# Patient Record
Sex: Female | Born: 1955 | Race: Black or African American | Hispanic: No | Marital: Married
Health system: Northeastern US, Academic
[De-identification: ages and names within clinical notes are randomized; demographics above are authoritative.]

## PROBLEM LIST (undated history)

## (undated) DIAGNOSIS — I10 Essential (primary) hypertension: Secondary | ICD-10-CM

---

## 2018-01-10 ENCOUNTER — Encounter (HOSPITAL_COMMUNITY): Payer: Self-pay

## 2018-01-10 ENCOUNTER — Emergency Department (HOSPITAL_COMMUNITY): Payer: No Typology Code available for payment source

## 2018-01-10 ENCOUNTER — Emergency Department (HOSPITAL_COMMUNITY)
Admission: EM | Admit: 2018-01-10 | Discharge: 2018-01-10 | Disposition: A | Discharge status: Home / Self Care | Payer: No Typology Code available for payment source | Attending: Emergency Medicine | Admitting: Emergency Medicine

## 2018-01-10 ENCOUNTER — Other Ambulatory Visit: Payer: Self-pay

## 2018-01-10 DIAGNOSIS — Y9301 Activity, walking, marching and hiking: Secondary | ICD-10-CM | POA: Insufficient documentation

## 2018-01-10 DIAGNOSIS — W1830XA Fall on same level, unspecified, initial encounter: Secondary | ICD-10-CM | POA: Insufficient documentation

## 2018-01-10 DIAGNOSIS — Y92481 Parking lot as the place of occurrence of the external cause: Secondary | ICD-10-CM | POA: Diagnosis not present

## 2018-01-10 DIAGNOSIS — Y999 Unspecified external cause status: Secondary | ICD-10-CM | POA: Insufficient documentation

## 2018-01-10 DIAGNOSIS — S8991XA Unspecified injury of right lower leg, initial encounter: Secondary | ICD-10-CM | POA: Diagnosis present

## 2018-01-10 DIAGNOSIS — M25561 Pain in right knee: Secondary | ICD-10-CM | POA: Insufficient documentation

## 2018-01-10 HISTORY — DX: Essential (primary) hypertension: I10

## 2018-01-10 MED ORDER — IBUPROFEN 800 MG PO TABS
800.0000 mg | ORAL_TABLET | Freq: Once | ORAL | Status: AC
  Administered 2018-01-10: 800 mg via ORAL
  Filled 2018-01-10: qty 1

## 2018-01-10 NOTE — Discharge Instructions (Addendum)
Your x-rays look good! There are no fractures or dislocations.   You are on the right track with keeping your leg elevated and icing it when you are at rest. You may use Tylenol and/or Ibuprofen/Naproxen for pain relief and swelling. Don't be surprised if you are more sore tomorrow.  Thank you for allowing me to take care of you today! [facility] and enjoy the rest of your conference!

## 2018-01-10 NOTE — ED Provider Notes (Signed)
[facility]-EMERGENCY DEPT Provider Note  CSN: [id number] Arrival date & time: 01/10/18  1135  History   Chief Complaint Chief Complaint  Patient presents with  . Knee Injury    HPI Melissa Hendricks is a 62 y.o. female with a history of HTN who presented to the ED following a mechanical fall this morning. Patient reportedly was walking when she tripped over a parking barrier while carrying bags. The accident occurred 1 hour ago. It is reported that the patient did not have LOC and was ambulatory on scene. She complains of right knee pain and swelling. Patient states she is able to bear weight and ambulate normally, but it is a little painful. Denies dizziness, lightheadedness, paresthesias, weakness or foot drop preceding the accident. Patient has been icing her knee since the accident.       Past Medical History:  Diagnosis Date  . Hypertension     There are no active problems to display for this patient.   History reviewed. No pertinent surgical history.   OB History   None      Home Medications    Prior to Admission medications   Not on File    Family History No family history on file.  Social History Social History   Tobacco Use  . Smoking status: Never Smoker  . Smokeless tobacco: Never Used  Substance Use Topics  . Alcohol use: Never    Frequency: Never  . Drug use: Never     Allergies   Patient has no known allergies.   Review of Systems Review of Systems  Constitutional: Negative.   Eyes: Negative for visual disturbance.  Musculoskeletal: Positive for arthralgias and joint swelling. Negative for gait problem.  Skin: Negative for color change, pallor and wound.  Neurological: Negative for dizziness, weakness, light-headedness and numbness.     Physical Exam Updated Vital Signs BP 120/89 (BP Location: Left Arm)   Pulse (!) 102   Temp 98 F (36.7 C) (Oral)   Resp 17   Ht 5' 4.75" (1.645 m)   Wt 98.4 kg (217 lb)    SpO2 96%   BMI 36.39 kg/m   Physical Exam  Cardiovascular:  Pulses:      Dorsalis pedis pulses are 2+ on the right side, and 2+ on the left side.       Posterior tibial pulses are 2+ on the right side, and 2+ on the left side.  Musculoskeletal:       Right hip: Normal.       Left hip: Normal.       Right knee: She exhibits swelling. She exhibits normal range of motion, no effusion, normal alignment, no LCL laxity, normal patellar mobility, no bony tenderness and no MCL laxity. Tenderness found.       Left knee: Normal.       Right ankle: Normal.       Left ankle: Normal.  Right knee is swollen below the patella when compared to the left. No overt ecchymosis visualized. No abrasions or open wounds. Right lower leg tender to palpation along the lateral aspect of knee. No bony tenderness. Negative ACL/PCL/MCL/LCL laxity tests. 5/5 strength in lower extremities bilaterally with full active and passive ROM.   Neurological: She has normal strength. No sensory deficit. She exhibits normal muscle tone. Coordination and gait normal.  Reflex Scores:      Patellar reflexes are 2+ on the right side and 2+ on the left side.  Achilles reflexes are 2+ on the right side and 2+ on the left side. Skin: Skin is warm and intact. No abrasion, no bruising and no ecchymosis noted.     ED Treatments / Results  Labs (all labs ordered are listed, but only abnormal results are displayed) Labs Reviewed - No data to display  EKG None  Radiology Dg Knee Complete 4 Views Right  Result Date: 01/10/2018 CLINICAL DATA:  Right knee injury when the patient tripped over a parking barrier while walking today. Initial encounter. EXAM: RIGHT KNEE - COMPLETE 4+ VIEW COMPARISON:  None. FINDINGS: No evidence of fracture, dislocation, or joint effusion. No evidence of arthropathy or other focal bone abnormality. Soft tissues are unremarkable. IMPRESSION: Normal exam. Electronically Signed   By: Drusilla Kanner M.D.    On: 01/10/2018 12:32    Procedures Procedures (including critical care time)  Medications Ordered in ED Medications  ibuprofen (ADVIL,MOTRIN) tablet 800 mg (800 mg Oral Given 01/10/18 1301)     Initial Impression / Assessment and Plan / ED Course  Triage vital signs and the nursing notes have been reviewed.  Pertinent labs & imaging results that were available during care of the patient were reviewed and considered in medical decision making (see chart for details).  Patient presents after a mechanical fall where she landed on her right knee. She has full sensation in right lower extremity. She also has full active and passive ROM. There is swelling below the patella, but no effusion. Patient is able to bear full weight and ambulate without issue, assistance or pain. Neurovascular function is intact. No laxity in ACL/PCL/MCL/LCL. Physical exam and x-rays are reassuring. There are no other physical exam findings or s/s that suggest an underlying infectious or rheumatologic process that warrants evaluation.   Clinical Course as of Jan 11 1344  Wed Jan 10, 2018  1247 Knee x-ray was normal. No fractures or dislocations.   [GM]    Clinical Course User Index [GM] Mortis, Sharyon Medicus, PA-C   Final Clinical Impressions(s) / ED Diagnoses  1. Right Knee Pain. Education provided on OTC and supportive treatment for pain relief and inflammation.  Dispo: Home. After thorough clinical evaluation, this patient is determined to be medically stable and can be safely discharged with the previously mentioned treatment and/or outpatient follow-up/referral(s). At this time, there are no other apparent medical conditions that require further screening, evaluation or treatment.  Final diagnoses:  Acute pain of right knee    ED Discharge Orders    None        Reva Bores 01/10/18 1345    Bethann Berkshire, MD 01/10/18 [phone]

## 2018-01-10 NOTE — ED Triage Notes (Signed)
Pt states she was walking, not paying attention to the ground and tripped on a parking barrier. Right knee swelling noted

## 2019-11-03 IMAGING — CR DG KNEE COMPLETE 4+V*R*
4 series · 4 of 4 positions shown · non-contrast
Comparison: None.

CLINICAL DATA: Right knee injury when the patient tripped over a
parking barrier while walking today. Initial encounter.

EXAM:
RIGHT KNEE - COMPLETE 4+ VIEW

[t knee ap right]
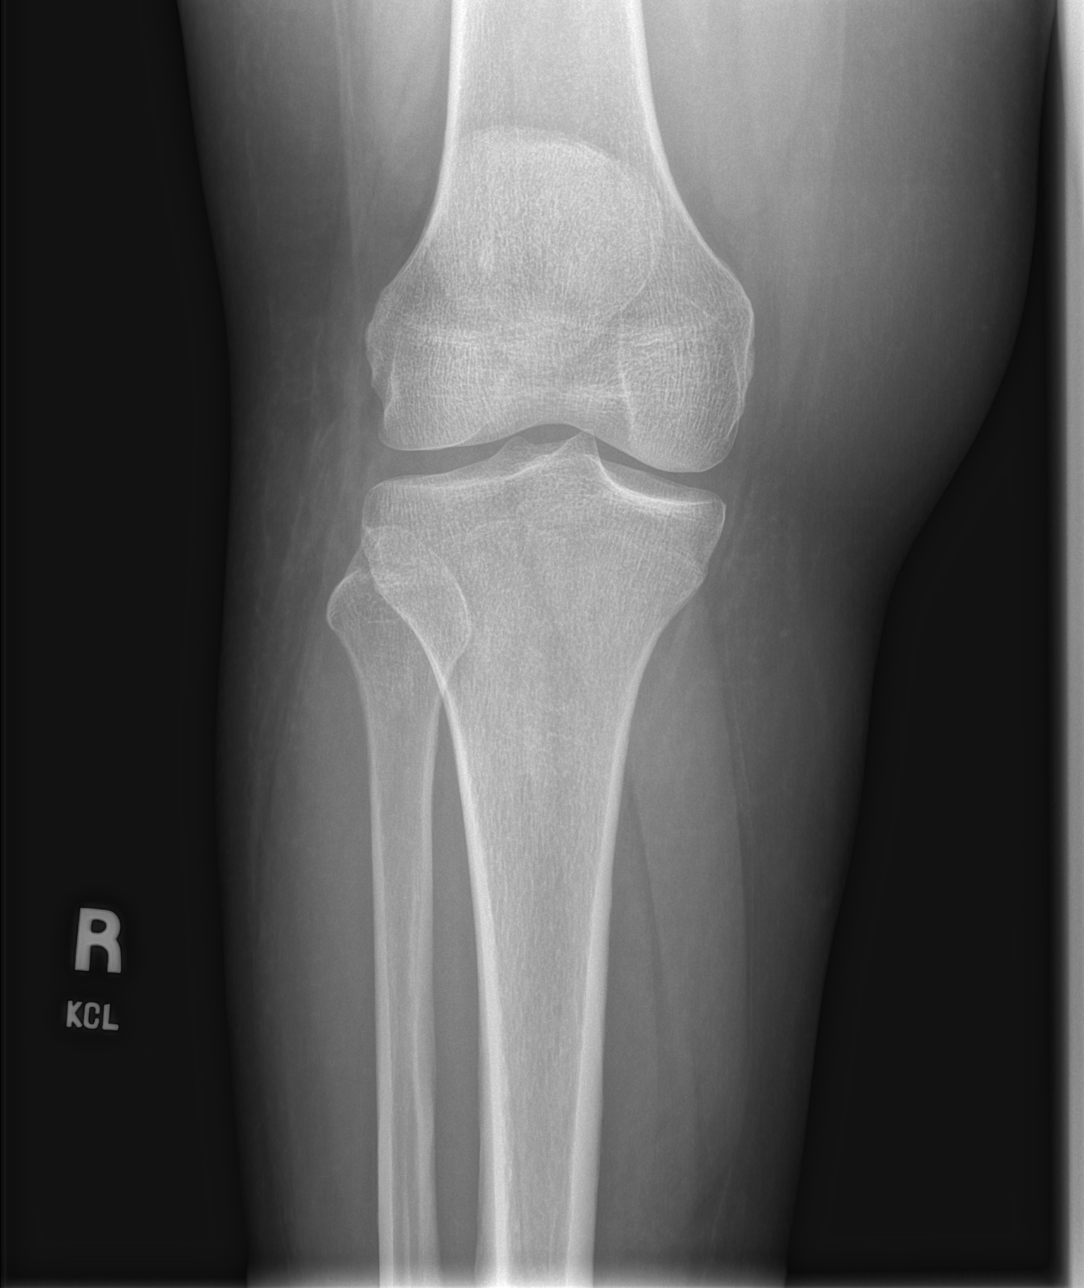

[t knee obl right (1 of 2)]
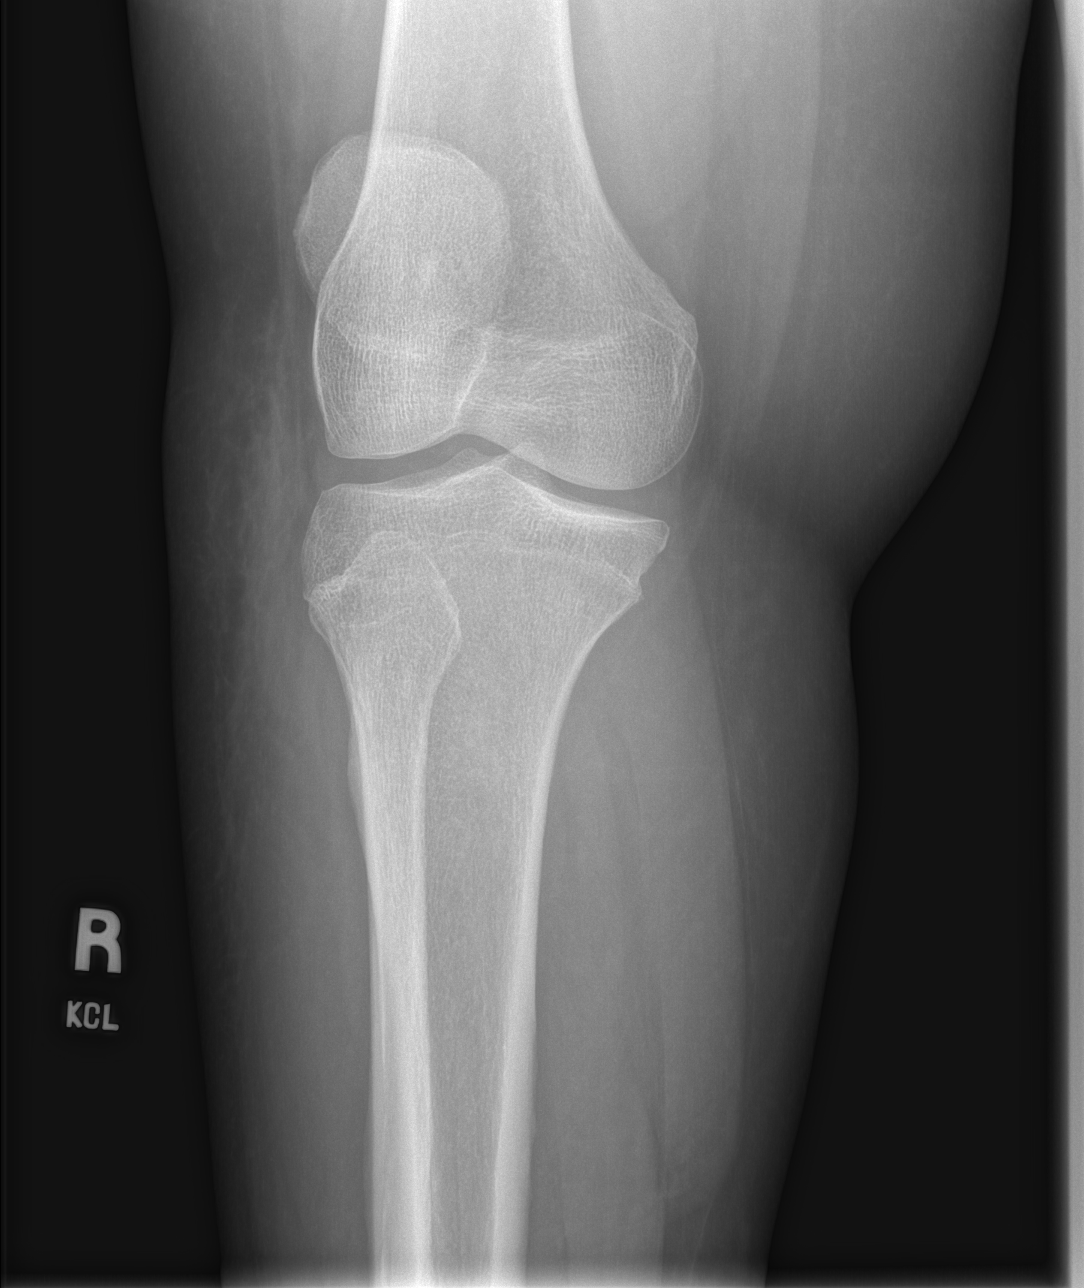

[t knee obl right (2 of 2)]
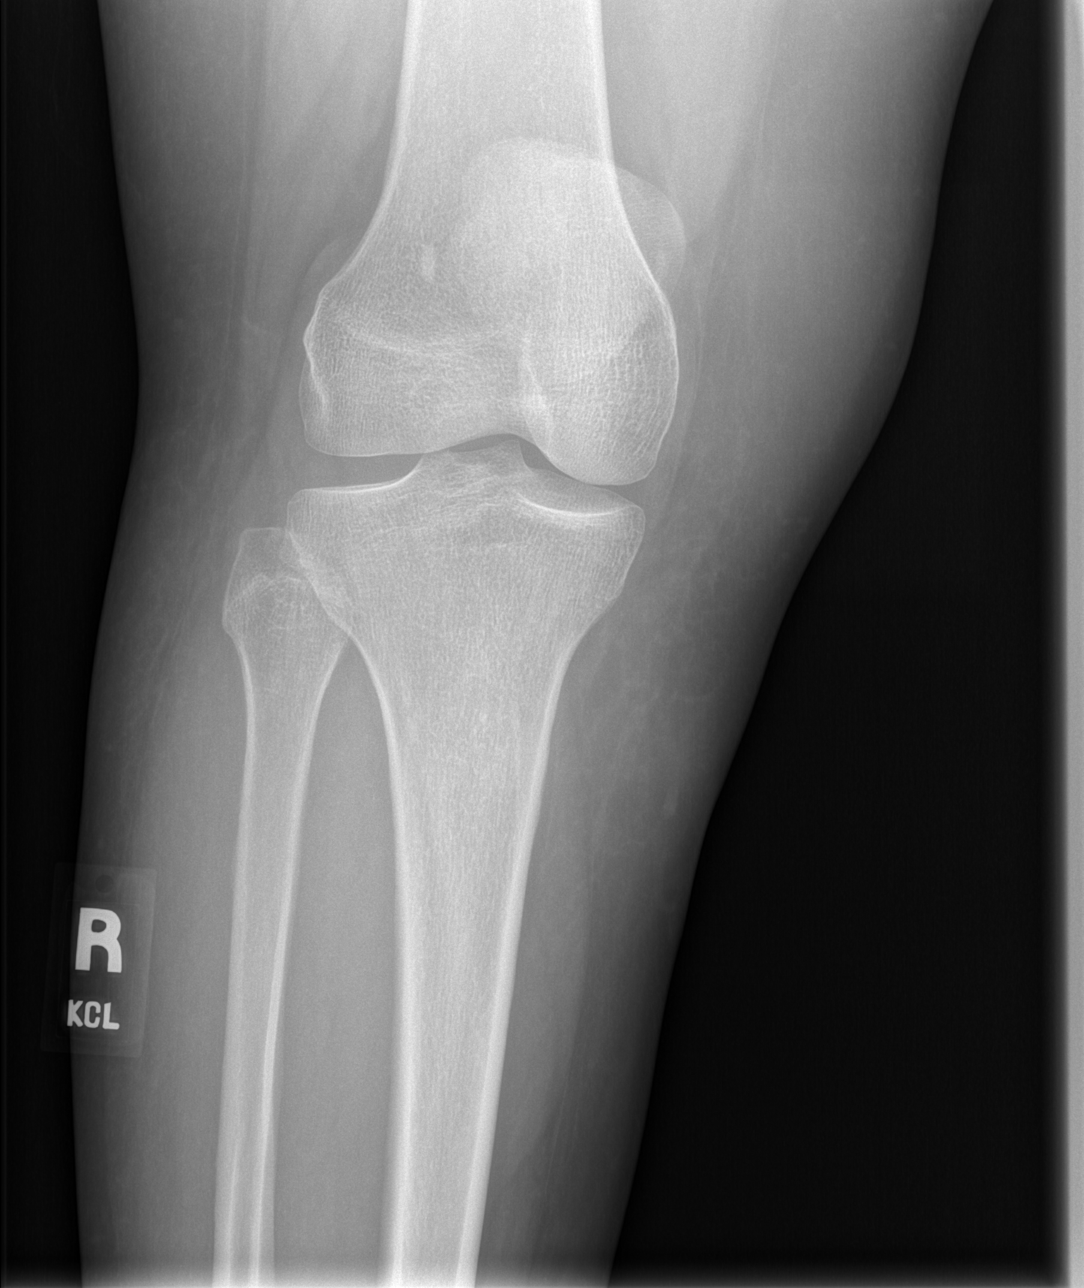

[t knee lat right]
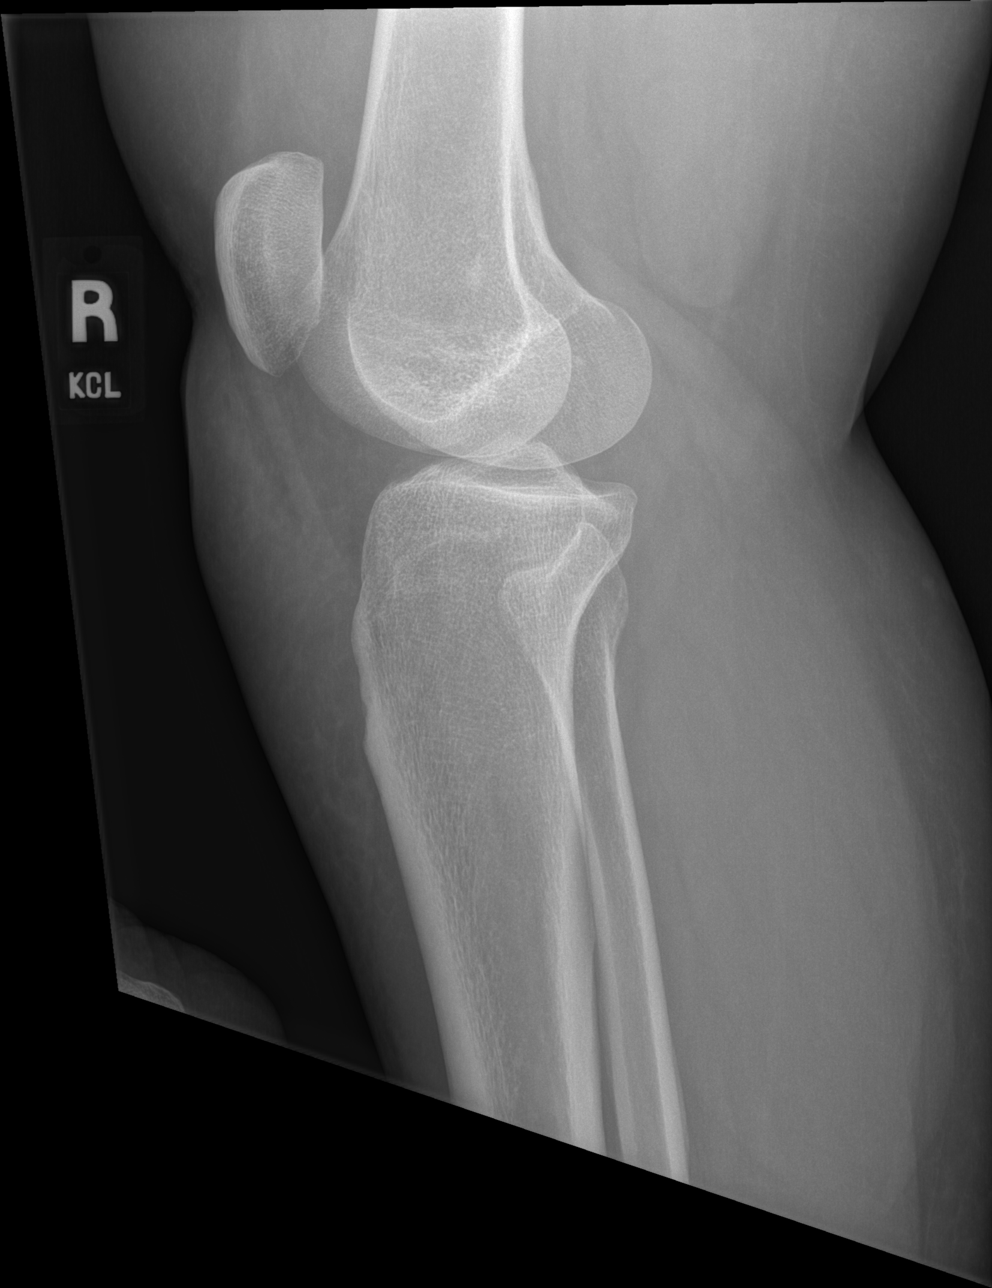

[4 of 4 positions shown; findings below may reference images not displayed]

FINDINGS: No evidence of fracture, dislocation, or joint effusion. No evidence
of arthropathy or other focal bone abnormality. Soft tissues are
unremarkable.
IMPRESSION: Normal exam.

## 2020-10-30 ENCOUNTER — Encounter

## 2020-10-30 ENCOUNTER — Encounter (INDEPENDENT_AMBULATORY_CARE_PROVIDER_SITE_OTHER): Admitting: Internal Medicine

## 2020-10-30 ENCOUNTER — Other Ambulatory Visit

## 2020-10-30 ENCOUNTER — Other Ambulatory Visit: Admit: 2020-10-30 | Payer: MEDICARE | Primary: Internal Medicine

## 2020-10-30 ENCOUNTER — Ambulatory Visit: Admit: 2020-10-30 | Discharge: 2020-10-30 | Payer: MEDICARE | Attending: Internal Medicine | Primary: Internal Medicine

## 2020-10-30 VITALS — BP 107/73 | HR 75 | Ht 64.8 in | Wt 210.0 lb

## 2020-10-30 DIAGNOSIS — Z0000 Encounter for general adult medical examination without abnormal findings: Secondary | ICD-10-CM

## 2020-10-30 DIAGNOSIS — E119 Type 2 diabetes mellitus without complications: Secondary | ICD-10-CM

## 2020-10-30 LAB — URINALYSIS REFLEX TO CULTURE
Bilirubin, Ur: NEGATIVE
Blood, Ur: NEGATIVE
Glucose, Ur: NEGATIVE
Hyaline Casts, Ur: 1.5 /LPF (ref 0–8)
Leukocyte Esterase, Ur: NEGATIVE
Nitrite, Ur: NEGATIVE
RBC, Ur: 3.2 /HPF (ref 0–4)
Specific Gravity, Ur: 1.023 (ref 1.001–1.035)
Squamous Epithelial Cells, Ur: NEGATIVE /HPF
Urobilinogen, Ur: 1 EU
WBC, Ur: 1 /HPF (ref 0–5)
pH, Ur: 6

## 2020-10-30 LAB — COMPREHENSIVE METABOLIC PANEL
ALT: 14 U/L (ref 0–55)
AST: 14 U/L (ref 6–42)
Albumin: 4.5 g/dL (ref 3.2–5.0)
Alkaline phosphatase: 87 U/L (ref 30–130)
Anion Gap: 10 mmol/L (ref 3–14)
BUN: 17 mg/dL (ref 6–24)
Bilirubin, total: 0.7 mg/dL (ref 0.2–1.2)
CO2 (Bicarbonate): 31 mmol/L (ref 20–32)
Calcium: 10.2 mg/dL (ref 8.5–10.5)
Chloride: 98 mmol/L (ref 98–110)
Creatinine: 0.97 mg/dL (ref 0.55–1.30)
Glucose: 156 mg/dL — ABNORMAL HIGH (ref 70–139)
Potassium: 4 mmol/L (ref 3.6–5.2)
Protein, total: 8 g/dL (ref 6.0–8.4)
Sodium: 139 mmol/L (ref 135–146)
eGFRcr: 64 mL/min/{1.73_m2} (ref >=60)

## 2020-10-30 LAB — CBC WITH DIFFERENTIAL
Basophils %: 0.4 %
Basophils Absolute: 0.04 10*3/uL (ref 0.00–0.22)
Eosinophils %: 1 %
Eosinophils Absolute: 0.09 10*3/uL (ref 0.00–0.50)
Hematocrit: 35 % (ref 32.0–47.0)
Hemoglobin: 11.9 g/dL (ref 11.0–16.0)
Immature Granulocytes %: 0.4 %
Immature Granulocytes Absolute: 0.04 10*3/uL (ref 0.00–0.10)
Lymphocyte %: 33.3 %
Lymphocytes Absolute: 3.11 10*3/uL (ref 0.70–4.00)
MCH: 25.8 pg — ABNORMAL LOW (ref 26.0–34.0)
MCHC: 34 g/dL (ref 31.0–37.0)
MCV: 75.9 fL — ABNORMAL LOW (ref 80.0–100.0)
MPV: 9.9 fL (ref 9.1–12.4)
Monocytes %: 5.7 %
Monocytes Absolute: 0.53 10*3/uL (ref 0.36–0.77)
NRBC %: 0 % (ref 0.0–0.0)
NRBC Absolute: 0 10*3/uL (ref 0.00–2.00)
Neutrophil %: 59.2 %
Neutrophils Absolute: 5.53 10*3/uL (ref 1.50–7.95)
Platelets: 376 10*3/uL (ref 150–400)
RBC: 4.61 M/uL (ref 3.70–5.20)
RDW-CV: 14.2 % (ref 11.5–14.5)
RDW-SD: 38.5 fL (ref 35.0–51.0)
WBC: 9.3 10*3/uL (ref 4.0–11.0)

## 2020-10-30 LAB — RAINBOW DRAW SST GOLD TOP

## 2020-10-30 LAB — ALBUMIN, URINE, RANDOM (INCLUDING CREATININE)
Albumin, urine (INT/EXT): 6 mg/dL
Albumin/Creatinine ratio: 30 mg/g{creat} (ref <=30)
Creatinine, urine, random (INT/EXT): 200.6 mg/dL (ref 15.00–328.00)

## 2020-10-30 LAB — LIPID PANEL
Cholesterol/HDL Ratio: 3.3
Cholesterol: 186 mg/dL (ref <=200)
HDL cholesterol: 57 mg/dL (ref >=40)
LDL cholesterol, calculated: 115 mg/dL (ref 0–130)
Triglycerides: 70 mg/dL (ref <=150)

## 2020-10-30 LAB — FOLATE: Folate: 19.5 ng/mL (ref >=3.5)

## 2020-10-30 LAB — TSH WITH REFLEX: TSH: 1.85 u[IU]/mL (ref 0.35–4.94)

## 2020-10-30 LAB — VITAMIN B12: Vitamin B-12: 444 pg/mL (ref 193–986)

## 2020-10-30 LAB — HEMOGLOBIN A1C: HEMOGLOBIN A1C % (INT/EXT): 7.3 % — ABNORMAL HIGH (ref <5.6)

## 2020-10-30 LAB — URIC ACID: Uric Acid: 7.4 mg/dL (ref 2.6–8.0)

## 2020-11-02 ENCOUNTER — Encounter (INDEPENDENT_AMBULATORY_CARE_PROVIDER_SITE_OTHER): Admitting: Internal Medicine

## 2020-11-04 LAB — VITAMIN D 25 HYDROXY
Vit D, 25-Hydroxy: 42 ng/mL (ref 20–100)
Vitamin D 25-OH, D2: <2 ng/mL
Vitamin D 25-OH, D3: 42 ng/mL

## 2020-11-11 ENCOUNTER — Telehealth (INDEPENDENT_AMBULATORY_CARE_PROVIDER_SITE_OTHER): Admitting: Internal Medicine

## 2020-11-11 NOTE — Telephone Encounter (Signed)
Please call patient to let her know  Blood count is normal, kidney function normal, liver normal, potassium normal, A1C is  Now 7.3 moving in the right direction and well controlled. Urine was clear

## 2020-11-12 NOTE — Telephone Encounter (Signed)
Pt returning call   [phone]

## 2020-11-20 NOTE — Telephone Encounter (Signed)
Ultrasound ordered due to dysuria and chronic pain on urination

## 2021-01-04 ENCOUNTER — Telehealth (INDEPENDENT_AMBULATORY_CARE_PROVIDER_SITE_OTHER): Admitting: Internal Medicine

## 2021-01-04 NOTE — Telephone Encounter (Signed)
 F/u on WQ - LMOM to schedule BP f/up per RG for August 2022

## 2021-02-15 ENCOUNTER — Other Ambulatory Visit

## 2021-02-16 ENCOUNTER — Ambulatory Visit: Payer: MEDICARE | Attending: Internal Medicine | Primary: Internal Medicine

## 2021-02-16 ENCOUNTER — Encounter (INDEPENDENT_AMBULATORY_CARE_PROVIDER_SITE_OTHER): Admitting: Internal Medicine

## 2021-02-25 ENCOUNTER — Other Ambulatory Visit (INDEPENDENT_AMBULATORY_CARE_PROVIDER_SITE_OTHER)

## 2021-02-25 ENCOUNTER — Encounter (INDEPENDENT_AMBULATORY_CARE_PROVIDER_SITE_OTHER)

## 2021-02-25 ENCOUNTER — Ambulatory Visit: Admit: 2021-02-25 | Discharge: 2021-02-25 | Payer: MEDICARE | Attending: Internal Medicine | Primary: Internal Medicine

## 2021-02-25 ENCOUNTER — Ambulatory Visit: Attending: Internal Medicine | Admitting: Internal Medicine

## 2021-02-25 ENCOUNTER — Encounter

## 2021-02-25 ENCOUNTER — Other Ambulatory Visit

## 2021-02-25 ENCOUNTER — Encounter (INDEPENDENT_AMBULATORY_CARE_PROVIDER_SITE_OTHER): Admitting: Internal Medicine

## 2021-02-25 VITALS — BP 120/80 | HR 83 | Temp 97.2°F | Ht 64.8 in | Wt 219.0 lb

## 2021-02-25 DIAGNOSIS — I10 Essential (primary) hypertension: Secondary | ICD-10-CM

## 2021-02-25 LAB — POCT GLUCOSE: Glucose Blood, POC: 163 mg/dL — AB (ref 70–139)

## 2021-02-25 LAB — POCT HGB A1C: POCT Hemoglobin A1C: 7.6 %

## 2021-02-26 LAB — URINALYSIS REFLEX TO CULTURE
Bilirubin, Ur: NEGATIVE
Blood, Ur: NEGATIVE
Glucose, Ur: NEGATIVE
Hyaline Casts, Ur: 0 /LPF (ref 0–8)
Ketones, Ur: NEGATIVE
Leukocyte Esterase, Ur: NEGATIVE
Nitrite, Ur: NEGATIVE
RBC, Ur: <1 /HPF (ref 0–4)
Specific Gravity, Ur: 1.016 (ref 1.001–1.035)
Squamous Epithelial Cells, Ur: NEGATIVE /HPF
Urobilinogen, Ur: 0.2 EU
WBC, Ur: <1 /HPF (ref 0–5)
pH, Ur: 5.5

## 2021-05-04 ENCOUNTER — Telehealth (INDEPENDENT_AMBULATORY_CARE_PROVIDER_SITE_OTHER): Admitting: Internal Medicine

## 2021-05-04 NOTE — Telephone Encounter (Signed)
 Unable to obtain NP records from Dr. Saundra Shelling office. The office has closed and all fax numbers and phone numbers we have found are now invalid. LMOM with pt if she has any information on where we can obtain records to let us know

## 2021-06-18 ENCOUNTER — Other Ambulatory Visit: Admitting: Internal Medicine

## 2021-06-18 ENCOUNTER — Encounter: Admitting: Internal Medicine

## 2021-06-18 LAB — IMG MW MAMMOGRAPHY 3D SCREENING

## 2021-06-18 LAB — IMG MW MAMMOGRAPHY DIGITAL SCREENING

## 2021-07-16 ENCOUNTER — Encounter (INDEPENDENT_AMBULATORY_CARE_PROVIDER_SITE_OTHER): Admitting: Internal Medicine

## 2021-08-31 ENCOUNTER — Encounter: Payer: MEDICARE | Attending: Internal Medicine | Primary: Internal Medicine

## 2021-08-31 ENCOUNTER — Encounter

## 2021-08-31 ENCOUNTER — Encounter: Payer: MEDICARE | Attending: Nurse Practitioner | Primary: Internal Medicine

## 2021-09-08 ENCOUNTER — Encounter (INDEPENDENT_AMBULATORY_CARE_PROVIDER_SITE_OTHER)

## 2021-09-08 ENCOUNTER — Ambulatory Visit: Admit: 2021-09-08 | Discharge: 2021-09-08 | Payer: MEDICARE | Attending: Nurse Practitioner | Primary: Internal Medicine

## 2021-09-08 ENCOUNTER — Other Ambulatory Visit

## 2021-09-08 ENCOUNTER — Encounter

## 2021-09-08 ENCOUNTER — Encounter (INDEPENDENT_AMBULATORY_CARE_PROVIDER_SITE_OTHER): Admitting: Nurse Practitioner

## 2021-09-08 ENCOUNTER — Telehealth (INDEPENDENT_AMBULATORY_CARE_PROVIDER_SITE_OTHER): Admitting: Nurse Practitioner

## 2021-09-08 VITALS — BP 132/82 | HR 88 | Temp 98.1°F | Ht 63.5 in | Wt 222.0 lb

## 2021-09-08 DIAGNOSIS — Z0000 Encounter for general adult medical examination without abnormal findings: Secondary | ICD-10-CM

## 2021-09-08 NOTE — Patient Instructions (Addendum)
I recommend following a diet low in saturated fat  Include three serving of non starchy vegetables, lean proteins and low fat dairy  Exercise 30 minutes daily  Aim for 2 liters water daily  Sunscreen when outdoors  I recommended that the patient limit carbohydrates at each meal and avoid excess sugar ( desserts, soda, juice, candy). Try to have lean proteins and plenty of water at each meal. Aim for three servings on non starchy vegetables daily. Walk or exercise thirty minutes daily.   Labs this week

## 2021-09-08 NOTE — Progress Notes (Signed)
[facility]  [facility] [place]  [address]  [address]  [place] [id number]  Dept: [phone]  Dept Fax: [phone]     Patient ID: Paulita FujitaGwendolyn T Ask is a 66 y.o. female who presents for AMW    Subjective   HPI     Elinor DodgeGwendolyn is a 66 year old female presenting for her AMW.   Pmhx: DM, HTN, HLD  She is feeling well today has no new concerns.     DM-  02/25/2021 A1c was 7.6%   Metformin 500 mg twice daily  Diabetic eye exam scheduled for April 2023. Glasses     HTN-  Home blood pressures have been 130/80s  She is on losartan 50 mg daily.     HLD-  She on atorvastatin 40 mg daily. Denies myalgia or chest pain.    Dental cleanings three times yearly.     Bone density-  [facility]- report requested. Per patient was normal.    Mammo- 06/18/2021 Neg    Colonoscopy- Dr. Purcell Nailsaitelbaum 05/24/2017  A. Colon-cecal   Sessile serrated adenomas / polyps. No dysplasia seen.   B. Colon-Sigmoid   Hyperplastic polyp.   Recall 5 year - referral sent today       No CP, dizziness, lightheadedness or SOB  No abdominal pain.  No GU symptoms at this time.  No fever/ chills  NO skin changes    No further concerns today      Patient Active Problem List   Diagnosis   . Breast density   . Type 2 diabetes mellitus without complication, without long-term current use of insulin (CMS/HCC)   . Primary hypertension   . Dyslipidemia     Current Outpatient Medications   Medication Instructions   . atorvastatin (Lipitor) 40 mg tablet TAKE 1 TABLET BY MOUTH EVERY DAY   . losartan (Cozaar) 50 mg tablet TAKE 1 TABLET BY MOUTH EVERYDAY AT BEDTIME   . metFORMIN (Glucophage) 500 mg tablet TAKE 2 TABLETS BY MOUTH DAILY IN THE MORNING AND TAKE 1 TABLET IN THE EVENING     No Known Allergies  Past Medical History:   Diagnosis Date   . Allergic    . Chronic kidney disease    . Diabetes mellitus (CMS/HCC)    . Headache    .  High cholesterol    . Hypertension    . Varicella      History reviewed. No pertinent surgical history.  Family History   Problem Relation Name Age of Onset   . Heart attack Mother     . Colon cancer Father     . Diabetes Sister Consuella Loselaine    . Heart attack Brother     . No Known Problems Brother     . Heart attack Maternal Grandmother     . Heart attack Maternal Grandfather     . Hypertension Paternal Grandmother Arelia    . Cancer Paternal Grandfather Tina GriffithsGrandpa Turner      Social History     Tobacco Use   . Smoking status: Former     Packs/day: 0.50     Years: 10.00     Pack years: 5.00     Types: Cigarettes     Quit date: 06/20/1980     Years since quitting: 41.2   . Smokeless tobacco: Never   . Tobacco comments:     Quit almost  40 years  ago   Vaping Use   . Vaping status: Never Used     Passive vaping exposure: Yes   Substance Use Topics   . Alcohol use: Not Currently   . Drug use: Never     There are no discontinued medications.  Current Outpatient Medications on File Prior to Visit   Medication Sig Dispense Refill   . atorvastatin (Lipitor) 40 mg tablet TAKE 1 TABLET BY MOUTH EVERY DAY 90 tablet 0   . losartan (Cozaar) 50 mg tablet TAKE 1 TABLET BY MOUTH EVERYDAY AT BEDTIME 90 tablet 0   . metFORMIN (Glucophage) 500 mg tablet TAKE 2 TABLETS BY MOUTH DAILY IN THE MORNING AND TAKE 1 TABLET IN THE EVENING (Patient taking differently: Take 500 mg by mouth with breakfast and with evening meal.) 270 tablet 0       Objective   Visit Vitals  BP 132/82 (BP Location: Right arm)   Pulse 88   Temp 36.7 C (98.1 F) (Temporal)   Ht 1.613 m   Wt 100.7 kg   SpO2 96%   BMI 38.71 kg/m   BSA 2.12 m       Physical Exam  Constitutional:       General: She is not in acute distress.     Appearance: Normal appearance. She is not ill-appearing.   HENT:      Head: Normocephalic and atraumatic.      Right Ear: Tympanic membrane normal.      Left Ear: Tympanic membrane normal.      Nose: Nose normal. No congestion or rhinorrhea.       Mouth/Throat:      Mouth: Mucous membranes are moist.      Pharynx: Oropharynx is clear. No oropharyngeal exudate or posterior oropharyngeal erythema.   Eyes:      Extraocular Movements: Extraocular movements intact.      Conjunctiva/sclera: Conjunctivae normal.      Pupils: Pupils are equal, round, and reactive to light.   Cardiovascular:      Rate and Rhythm: Normal rate and regular rhythm.      Pulses: Normal pulses.      Heart sounds: Normal heart sounds.   Pulmonary:      Effort: Pulmonary effort is normal. No respiratory distress.      Breath sounds: Normal breath sounds.   Abdominal:      Palpations: Abdomen is soft.   Musculoskeletal:         General: No swelling or tenderness. Normal range of motion.      Cervical back: Normal range of motion and neck supple. No tenderness.      Right lower leg: No edema.      Left lower leg: No edema.   Lymphadenopathy:      Cervical: No cervical adenopathy.   Skin:     General: Skin is warm and dry.   Neurological:      General: No focal deficit present.      Mental Status: She is alert and oriented to person, place, and time.      Coordination: Coordination normal.      Gait: Gait normal.   Psychiatric:         Mood and Affect: Mood normal.         Behavior: Behavior normal.         Thought Content: Thought content normal.         Judgment: Judgment normal.         Assessment/Plan  Sabah was seen today for annual exam.  Medicare annual wellness visit, subsequent  Adenomatous polyp of sigmoid colon  -     EXT  Gastroenterology Referral; Future  Encounter for screening for malignant neoplasm of colon  -     EXT  Gastroenterology Referral; Future  Primary hypertension  -     Comprehensive metabolic panel; Future  Dyslipidemia  -     Lipid panel; Future  Type 2 diabetes mellitus without complication, without long-term current use of insulin (CMS/HCC)  -     Hemoglobin A1c; Future  -     Vitamin B12 with reflex to MMA; Future  -     Albumin, Urine, Random (Microalbumin  Random, Including Creatinine); Future  Low mean corpuscular volume (MCV)  -     CBC and differential; Future  Long term current use of therapeutic drug  -     Vitamin B12 with reflex to MMA; Future    Medicare Annual Wellness Visit Questionnaires  Demographics  Age: 66 y.o.  Gender: female  What is your race: Black or African American  Do you live alone: no    Health Status  How would you rate your health in general: good  Do you have problems with mobility: No   Are you able to function independently in the community: Yes     Psychosocial  PHQ2/9  Over the past 2 weeks, how often have you been bothered by any of the following problems?  Little interest or pleasure in doing things: Not at all  Feeling down, depressed, or hopeless: Not at all  Patient Health Questionnaire-2 Score: 0    Over the past 2 weeks, how often have you been bothered by any of the following problems?  Trouble falling or staying asleep, or sleeping too much: Not at all  Feeling tired or having little energy: Not at all  Poor appetite or overeating: Not at all  Feeling bad about yourself - or that you are a failure or have let yourself or your family down: Not at all  Trouble concentrating on things, such as reading the newspaper or watching television: Not at all  Moving or speaking so slowly that other people could have noticed? Or the opposite - being so fidgety or restless that you have been moving around a lot more than usual.: Not at all  Thoughts that you would be better off dead or hurting yourself in some way: Not at all  Patient Health Questionnaire-9 Score: 0        Tobacco Use/Smoking/Alcohol  Social History     Tobacco Use   Smoking Status Former   . Packs/day: 0.50   . Years: 10.00   . Pack years: 5.00   . Types: Cigarettes   . Quit date: 06/20/1980   . Years since quitting: 41.2   Smokeless Tobacco Never   Tobacco Comments    Quit almost  40 years ago   Vaping Use   Vaping Status Never Used   . Passive vaping exposure: Yes       Social History     Substance and Sexual Activity   Alcohol Use Not Currently        Behavioral  Do you exercise: Yes, (1-2x per week)    Mini Cog  Three word recall (ex. Apple, Boyd Kerbs, Table): Three word recall  Clock drawing task: Patient does draw the face of clock properly  RECALL: 3/3     ADL/IADL  Patient's hearing adequate to  safely complete daily activities: Yes  Patient's Level of Independence:  . Dressing: Independent  . Grooming: Independent  . Feeding: Independent  . Bathing: Independent  . Toileting: Independent  . In/Out Bed: Independent  . Walks in home: Independent    [place] Instrumental Activities of Daily Living  . Ability to use telephone: Operates telephone on own initiative, looks up  . Shopping: Takes care of all shopping needs independently  . Food Preparation: Plans, prepares, and serves adequate  . Housekeeping: Maintains house alone with occasion assistance  . Laundry: Does personal laundry completely  . Mode of transportation: Travels independently  . Responsibility for own medications: Is responsible for taking medications in correct  . Ability to handle finances:Manages financial matters independently (budgets)    Falls  Assistive devices: 0  How many falls have you had within the last year: No falls in the past year    Home Safety Evaluation  Does your home have throw rugs: Yes  Does your home have poor lighting: No  Does your home have a slippery tub: No  Does your home have grab bars in the bathroom: Yes  Does your home have handrails on the stairs/steps: Yes  Does your home have functioning smoke alarms: Yes  Does your home have functioning carbon monoxide alarms: Yes    Healthcare directives  Advance directive: Patient has advance directive, copy not in chart  Have you reviewed your advance care directive: No  Type of healthcare directive: Other: Health Care Proxy Husband Lahoma Rocker  Pre-existing DNR/DNI order: No     A/P    The patient  is alert and oriented x3, pleasant and  cooperative.   Well-groomed. Maintains eye contact.   I recommend following a diet low in saturated fat  Include three serving of non starchy vegetables, lean proteins and low fat dairy  Exercise 30 minutes daily  Aim for 2 liters water daily  Sunscreen when outdoors  I recommended that the patient limit carbohydrates at each meal and avoid excess sugar ( desserts, soda, juice, candy). Try to have lean proteins and plenty of water at each meal. Aim for three servings on non starchy vegetables daily. Walk or exercise thirty minutes daily.   Labs this week

## 2021-09-08 NOTE — Telephone Encounter (Signed)
Please reach out to [facility] for Bone density Report . Pt thinks she had this in June 2021

## 2021-09-09 ENCOUNTER — Encounter (INDEPENDENT_AMBULATORY_CARE_PROVIDER_SITE_OTHER)

## 2021-09-09 NOTE — Telephone Encounter (Signed)
Dexa done 04/14/20 report in meditech I will print tomorrow when I'm in office.

## 2021-09-10 ENCOUNTER — Other Ambulatory Visit: Admit: 2021-09-10 | Payer: MEDICARE | Primary: Internal Medicine

## 2021-09-10 DIAGNOSIS — E119 Type 2 diabetes mellitus without complications: Secondary | ICD-10-CM

## 2021-09-10 LAB — COMPREHENSIVE METABOLIC PANEL
ALT: 15 U/L (ref 0–55)
AST: 16 U/L (ref 6–42)
Albumin: 4.6 g/dL (ref 3.2–5.0)
Alkaline phosphatase: 99 U/L (ref 30–130)
Anion Gap: 13 mmol/L (ref 3–14)
BUN: 13 mg/dL (ref 6–24)
Bilirubin, total: 0.5 mg/dL (ref 0.2–1.2)
CO2 (Bicarbonate): 29 mmol/L (ref 20–32)
Calcium: 10.1 mg/dL (ref 8.5–10.5)
Chloride: 97 mmol/L — ABNORMAL LOW (ref 98–110)
Creatinine: 0.9 mg/dL (ref 0.55–1.30)
Glucose: 237 mg/dL — ABNORMAL HIGH (ref 70–139)
Potassium: 4.2 mmol/L (ref 3.6–5.2)
Protein, total: 8.2 g/dL (ref 6.0–8.4)
Sodium: 139 mmol/L (ref 135–146)
eGFRcr: 70 mL/min/{1.73_m2} (ref >=60)

## 2021-09-10 LAB — CBC WITH DIFFERENTIAL
Basophils %: 0.4 %
Basophils Absolute: 0.04 10*3/uL (ref 0.00–0.22)
Eosinophils %: 1.2 %
Eosinophils Absolute: 0.12 10*3/uL (ref 0.00–0.50)
Hematocrit: 38.3 % (ref 32.0–47.0)
Hemoglobin: 12.9 g/dL (ref 11.0–16.0)
Immature Granulocytes %: 0.3 %
Immature Granulocytes Absolute: 0.03 10*3/uL (ref 0.00–0.10)
Lymphocyte %: 35.3 %
Lymphocytes Absolute: 3.48 10*3/uL (ref 0.70–4.00)
MCH: 25.6 pg — ABNORMAL LOW (ref 26.0–34.0)
MCHC: 33.7 g/dL (ref 31.0–37.0)
MCV: 76 fL — ABNORMAL LOW (ref 80.0–100.0)
MPV: 10 fL (ref 9.1–12.4)
Monocytes %: 5.7 %
Monocytes Absolute: 0.56 10*3/uL (ref 0.36–0.77)
NRBC %: 0 % (ref 0.0–0.0)
NRBC Absolute: 0 10*3/uL (ref 0.00–2.00)
Neutrophil %: 57.1 %
Neutrophils Absolute: 5.63 10*3/uL (ref 1.50–7.95)
Platelets: 379 10*3/uL (ref 150–400)
RBC: 5.04 M/uL (ref 3.70–5.20)
RDW-CV: 15.2 % — ABNORMAL HIGH (ref 11.5–14.5)
RDW-SD: 41.1 fL (ref 35.0–51.0)
WBC: 9.9 10*3/uL (ref 4.0–11.0)

## 2021-09-10 LAB — LIPID PANEL
Cholesterol/HDL Ratio: 2.6
Cholesterol: 183 mg/dL (ref <=200)
HDL cholesterol: 70 mg/dL (ref >=40)
LDL cholesterol, calculated: 98 mg/dL (ref 0–130)
Non-HDL Calculation: 113 mg/dL (ref <160)
Triglycerides: 76 mg/dL (ref <=150)

## 2021-09-10 LAB — HEMOGLOBIN A1C: HEMOGLOBIN A1C % (INT/EXT): 9.1 % — ABNORMAL HIGH (ref <5.6)

## 2021-09-10 LAB — VITAMIN B12 WITH REFLEX TO MMA: Vitamin B-12: 740 pg/mL (ref 193–986)

## 2021-09-10 LAB — ALBUMIN, URINE, RANDOM (INCLUDING CREATININE)
Albumin, urine (INT/EXT): 56.6 mg/dL
Albumin/Creatinine ratio: 492 mg/g{creat} — ABNORMAL HIGH (ref <=30)
Creatinine, urine, random (INT/EXT): 115 mg/dL (ref 15.00–328.00)

## 2021-09-10 NOTE — Telephone Encounter (Signed)
Dexa scanned in pt. Chart.

## 2021-09-13 ENCOUNTER — Telehealth (INDEPENDENT_AMBULATORY_CARE_PROVIDER_SITE_OTHER): Admitting: Nurse Practitioner

## 2021-09-13 NOTE — Telephone Encounter (Signed)
Please call patient and set up telemedicine to review her lab results and uncontrolled diabetes.

## 2021-09-14 NOTE — Telephone Encounter (Signed)
I have room on Thursday

## 2021-09-15 NOTE — Telephone Encounter (Signed)
Please try one more time to reach patient and if we cannot get get then we will send a letter asking her to schedule asap to discuss her lab results.

## 2021-09-16 ENCOUNTER — Telehealth: Admit: 2021-09-16 | Discharge: 2021-09-16 | Payer: MEDICARE | Attending: Nurse Practitioner | Primary: Internal Medicine

## 2021-09-16 DIAGNOSIS — E119 Type 2 diabetes mellitus without complications: Secondary | ICD-10-CM

## 2021-09-16 MED ORDER — metFORMIN (Glucophage) 500 mg tablet
500 | ORAL_TABLET | Freq: Two times a day (BID) | ORAL | 11 refills | Status: AC
Start: 2021-09-16 — End: 2022-09-16

## 2021-09-16 NOTE — Patient Instructions (Signed)
I recommended that the patient limit carbohydrates at each meal and avoid excess sugar ( desserts, soda, juice, candy). Try to have lean proteins and plenty of water at each meal. Aim for three servings on non starchy vegetables daily. Walk or exercise thirty minutes daily.   Check blood glucose daily before meals ( pick different meals each day). Goal 80-130 before eating.

## 2021-09-16 NOTE — Progress Notes (Signed)
[facility]  [facility] [place]  [address]  [address]  [place] [id number]  Dept: [phone]  Dept Fax: [phone]     Patient ID: Terence LuxGwene T Orr is a 66 y.o. female who presents for Follow-up (Review labs ).    Subjective   HPI     Elinor DodgeGwendolyn is a 66 year old female presenting to discuss her recent labs.   09/10/2021 A1c was 9.1%, previously 7.6%   On 09/14/2021 I increased her  metformin from 500 mg BID to 1000 mg  BID. She reports that when she took this medication years ago she had clumps of hair coming out. She would like to stay on the 500 mg twice daily. We discussed adding medications options and she would like to be aggressive with her diet and exercise and recheck her A1c in three months.     No CP, dizziness, lightheadedness or SOB  No abdominal pain  No blood or melena.  No GU symptoms at this time.  No fever/ chills    No further concerns today      Component      Latest Ref Rng & Units 10/30/2020 02/25/2021 09/10/2021   Hemoglobin A1C      <5.6 % 7.3 (H) 7.6 9.1 (H)     Component      Latest Ref Rng & Units 10/30/2020 09/10/2021   Triglycerides      <=150 mg/dL 70 76   Cholesterol      <=200 mg/dL 440186 347183   HDL cholesterol      >=40 mg/dL 57 70   LDL cholesterol, calculated      0 - 130 mg/dL 425115 98   Cholesterol/HDL Ratio       3.3 2.6   Non-HDL Calculation      <160 mg/dL  956113     Component      Latest Ref Rng & Units 10/30/2020 09/10/2021   Sodium      135 - 146 mmol/L 139 139   Potassium      3.6 - 5.2 mmol/L 4.0 4.2   Chloride      98 - 110 mmol/L 98 97 (L)   CO2 (Bicarbonate)      20 - 32 mmol/L 31 29   Anion Gap      3 - 14 mmol/L 10 13   BUN      6 - 24 mg/dL 17 13   Creatinine      0.55 - 1.30 mg/dL 3.870.97 5.640.90   eGFRcr      >=60 mL/min/1.4238m*2 64 70   Glucose      70 - 139 mg/dL 332156 (H) 951237 (H)   Fasting?       Unknown Unknown   Calcium      8.5 - 10.5 mg/dL 88.410.2 16.610.1   Aspartate  Aminotransferase (AST)      6 - 42 U/L 14 16   Alanine Aminotransferase (ALT)      0 - 55 U/L 14 15   Alkaline phosphatase      30 - 130 U/L 87 99   Protein, total      6.0 - 8.4 g/dL 8.0 8.2   Albumin      3.2 - 5.0 g/dL 4.5 4.6   Bilirubin, total      0.2 - 1.2 mg/dL 0.7 0.5     Component      Latest Ref  Rng & Units 10/30/2020 09/10/2021   Vitamin B-12      193 - 986 pg/mL 444 740     Component      Latest Ref Rng & Units 10/30/2020 09/10/2021   Albumin, urine      mg/dL 6.0 60.4   Creatinine, urine, random      15.00 - 328.00 mg/dL 540.98 119.14   Albumin/Creatinine ratio      <=30 mg/g Creat 30 492 (H)     Patient Active Problem List   Diagnosis   . Breast density   . Type 2 diabetes mellitus without complication, without long-term current use of insulin (CMS/HCC)   . Primary hypertension   . Dyslipidemia     Current Outpatient Medications   Medication Instructions   . atorvastatin (LIPITOR) 40 mg, oral, Daily   . losartan (Cozaar) 50 mg tablet TAKE 1 TABLET BY MOUTH EVERYDAY AT BEDTIME   . metFORMIN (GLUCOPHAGE) 500 mg, oral, 2 times daily with meals     No Known Allergies  Past Medical History:   Diagnosis Date   . Allergic    . Chronic kidney disease    . Diabetes mellitus (CMS/HCC)    . Headache    . High cholesterol    . Hypertension    . Varicella      History reviewed. No pertinent surgical history.  Family History   Problem Relation Name Age of Onset   . Heart attack Mother     . Colon cancer Father     . Diabetes Sister Consuella Lose    . Heart attack Brother     . No Known Problems Brother     . Heart attack Maternal Grandmother     . Heart attack Maternal Grandfather     . Hypertension Paternal Grandmother Arelia    . Cancer Paternal Grandfather Tina Griffiths      Social History     Tobacco Use   . Smoking status: Former     Packs/day: 0.50     Years: 10.00     Pack years: 5.00     Types: Cigarettes     Quit date: 06/20/1980     Years since quitting: 41.2   . Smokeless tobacco: Never   . Tobacco comments:      Quit almost  40 years ago   Vaping Use   . Vaping status: Never Used     Passive vaping exposure: Yes   Substance Use Topics   . Alcohol use: Not Currently   . Drug use: Never     Medications Discontinued During This Encounter   Medication Reason   . metFORMIN (Glucophage) 500 mg tablet Side effects     Current Outpatient Medications on File Prior to Visit   Medication Sig Dispense Refill   . atorvastatin (Lipitor) 40 mg tablet Take 1 tablet (40 mg) by mouth once daily. 90 tablet 3   . losartan (Cozaar) 50 mg tablet TAKE 1 TABLET BY MOUTH EVERYDAY AT BEDTIME 90 tablet 0   . [DISCONTINUED] metFORMIN (Glucophage) 500 mg tablet Take 2 tablets (1,000 mg) by mouth with breakfast and with evening meal. (Patient taking differently: Take 500 mg by mouth with breakfast and with evening meal.) 360 tablet 0   . [DISCONTINUED] atorvastatin (Lipitor) 40 mg tablet TAKE 1 TABLET BY MOUTH EVERY DAY 90 tablet 0   . [DISCONTINUED] metFORMIN (Glucophage) 500 mg tablet TAKE 2 TABLETS BY MOUTH DAILY IN THE MORNING AND TAKE 1 TABLET IN THE  EVENING (Patient taking differently: Take 500 mg by mouth with breakfast and with evening meal.) 270 tablet 0       Objective   There were no vitals taken for this visit.    Physical Exam  Constitutional:       General: She is not in acute distress.     Appearance: Normal appearance. She is not ill-appearing.   HENT:      Head: Normocephalic and atraumatic.   Pulmonary:      Effort: Pulmonary effort is normal. No respiratory distress.   Neurological:      General: No focal deficit present.      Mental Status: She is alert and oriented to person, place, and time.   Psychiatric:         Mood and Affect: Mood normal.         Behavior: Behavior normal.         Thought Content: Thought content normal.         Judgment: Judgment normal.         Assessment/Plan   [name] was seen today for follow-up.  Type 2 diabetes mellitus without complication, without long-term current use of insulin (CMS/HCC)  -      Hemoglobin A1c; Future  -     metFORMIN (Glucophage) 500 mg tablet; Take 1 tablet (500 mg) by mouth with breakfast and with evening meal.    The patient  is alert and oriented x3, pleasant and cooperative.   Well-groomed. Maintains eye contact.   She is not interested in adding medications. She and her husband have already started working on her diet. I recommended that the patient limit carbohydrates at each meal and avoid excess sugar. Try to have lean proteins and plenty of water at each meal. Aim for three servings on non starchy vegetables daily. Walk or exercise thirty minutes daily.   Check blood glucose daily before meals ( pick different meals each day). Goal 80-130 before eating. Repeat A1c in 3 months.     This real-time, interactive virtual Telehealth encounter was done by video with the patient's verbal consent. Two patient identifiers were used and confirmed. Physical location of the patient: home. Patient resides in: [place]  Physical location of the provider: office. Other participants/involvement: none  Total minutes spent: 30

## 2022-02-23 ENCOUNTER — Encounter: Payer: MEDICARE | Attending: Internal Medicine | Primary: Internal Medicine

## 2022-02-23 NOTE — Telephone Encounter (Signed)
spoke with pt, rescheduled appt

## 2022-02-23 NOTE — Telephone Encounter (Signed)
LVM to Cx appt and for pt to CB & RS

## 2022-02-23 NOTE — Telephone Encounter (Signed)
Pt returned your call.

## 2022-03-31 ENCOUNTER — Ambulatory Visit: Admit: 2022-03-31 | Discharge: 2022-03-31 | Payer: MEDICARE | Attending: Internal Medicine | Primary: Internal Medicine

## 2022-03-31 DIAGNOSIS — I10 Essential (primary) hypertension: Secondary | ICD-10-CM

## 2022-03-31 NOTE — Progress Notes (Signed)
[address]  [place], [place] [address]  Today's date: 03/31/22  Name: Alyssa Orr  DOB: July 19, 1955  Age: 66 y.o.  PCP: Carron Curie, MD    Chief complaint:   Chief Complaint   Patient presents with   . Follow-up       History of Present Illness:       Patient presents for follow up of chroni ccmedical conditions.  Patient states thats he feels well  She states that she can feel her feet tinglng at times  She denies any lower extremity edema  He otherwise denies any chest pain or palpitations  She denies any shortness of breath  She denies sob or cough  She denies any pain            Review of Systems: Review of Systems   Constitutional: Negative for diaphoresis.   HENT: Negative for congestion.    Respiratory: Negative for chest tightness and wheezing.    Cardiovascular: Negative for leg swelling.   Gastrointestinal: Negative for abdominal pain.   Genitourinary: Negative for dysuria and hematuria.   Neurological: Negative for weakness.        Past Medical History:   Past Medical History:   Diagnosis Date   . Allergic    . Chronic kidney disease    . Diabetes mellitus (CMS/HCC)    . Headache    . High cholesterol    . Hypertension    . Varicella        Past Surgical History:   History reviewed. No pertinent surgical history.    Social History:   Social History     Socioeconomic History   . Marital status: Married     Spouse name: Lahoma Rocker   . Number of children: 0   . Years of education: None   . Highest education level: None   Occupational History   . None   Tobacco Use   . Smoking status: Former     Packs/day: 0.50     Years: 10.00     Pack years: 5.00     Types: Cigarettes     Quit date: 01/11/1983     Years since quitting: 39.2   . Smokeless tobacco: Never   . Tobacco comments:     Quit almost  40 years ago   Vaping Use   . Vaping Use: Never used   Substance and Sexual Activity   . Alcohol use: Not Currently   . Drug use: Never   . Sexual activity: Yes     Partners: Male     Comment:  Husband   Other Topics Concern   . None   Social History Narrative   . None     Social Determinants of Health     Financial Resource Strain: Low Risk  (09/08/2021)    Overall Financial Resource Strain (CARDIA)    . Difficulty of Paying Living Expenses: Not hard at all   Food Insecurity: No Food Insecurity (09/08/2021)    Hunger Vital Sign    . Worried About Programme researcher, broadcasting/film/video in the Last Year: Never true    . Ran Out of Food in the Last Year: Never true   Transportation Needs: No Transportation Needs (09/08/2021)    PRAPARE - Transportation    . Lack of Transportation (Medical): No    . Lack of Transportation (Non-Medical): No   Physical Activity: Insufficiently Active (09/08/2021)    Exercise Vital Sign    . Days of  Exercise per Week: 2 days    . Minutes of Exercise per Session: 20 min   Stress: No Stress Concern Present (09/08/2021)    [employer] of Occupational Health - Occupational Stress Questionnaire    . Feeling of Stress : Not at all   Social Connections: Socially Integrated (09/08/2021)    Social Connection and Isolation Panel [NHANES]    . Frequency of Communication with Friends and Family: More than three times a week    . Frequency of Social Gatherings with Friends and Family: Three times a week    . Attends Religious Services: More than 4 times per year    . Active Member of Clubs or Organizations: Yes    . Attends Banker Meetings: More than 4 times per year    . Marital Status: Married   Catering manager Violence: Not on file   Housing Stability: Low Risk  (09/08/2021)    Housing Stability Vital Sign    . Unable to Pay for Housing in the Last Year: No    . Number of Places Lived in the Last Year: 1    . Unstable Housing in the Last Year: No        Family History:   Family History   Problem Relation Name Age of Onset   . Heart attack Mother Harley Alto    . Heart disease Mother Harley Alto    . Colon cancer Father Conrad Burlington, Turner,Sr.    . Cancer Father Conrad Burlington,  Turner,Sr.    . Diabetes Sister Consuella Lose    . Heart attack Brother     . No Known Problems Brother     . Heart attack Maternal Grandmother Romeo Rabon    . Diabetes Maternal Grandmother Romeo Rabon    . Heart attack Maternal Grandfather     . Hypertension Paternal Grandmother Orlin Hilding    . Cancer Paternal Grandfather Tina Griffiths        Allergies: No Known Allergies    Current Medications:   Current Outpatient Medications   Medication Sig Dispense Refill   . atorvastatin (Lipitor) 40 mg tablet Take 1 tablet (40 mg) by mouth once daily. 90 tablet 3   . losartan (Cozaar) 50 mg tablet TAKE 1 TABLET BY MOUTH EVERYDAY AT BEDTIME 90 tablet 0   . metFORMIN (Glucophage) 500 mg tablet Take 1 tablet (500 mg) by mouth with breakfast and with evening meal. 60 tablet 11     No current facility-administered medications for this visit.       Vital Signs:   Visit Vitals  BP 134/84 (BP Location: Left arm, Patient Position: Sitting, BP Cuff Size: Adult)   Pulse 88   Temp 36.6 C (97.9 F) (Temporal)   SpO2 99%        Physical Exam: Physical Exam  Constitutional:       Appearance: Normal appearance.   HENT:      Head: Normocephalic and atraumatic.   Eyes:      Conjunctiva/sclera: Conjunctivae normal.   Cardiovascular:      Rate and Rhythm: Normal rate and regular rhythm.      Heart sounds: Normal heart sounds.   Pulmonary:      Effort: Pulmonary effort is normal.      Breath sounds: Normal breath sounds.   Abdominal:      General: Abdomen is flat. Bowel sounds are normal.      Palpations: Abdomen is soft.  Musculoskeletal:      Cervical back: Neck supple.   Skin:     General: Skin is warm.   Neurological:      General: No focal deficit present.      Mental Status: She is alert.   Psychiatric:         Mood and Affect: Mood normal.          Assessment and Plan:        Esmae was seen today for follow-up.  Diagnoses and all orders for this visit:  Primary hypertension (Primary)  Comments:  Low  sodium diet  BP controlled  Contineu Losartan 50 mg daily  Orders:  -     CBC and differential - WAM and non-WAM; Future  -     Comprehensive metabolic panel; Future  -     TSH with reflex; Future  -     Albumin, Urine, Random (Microalbumin Random, Including Creatinine); Future  -     Vitamin B12; Future  -     Folate; Future  -     CBC and differential - WAM and non-WAM  -     Comprehensive metabolic panel  -     TSH with reflex  -     Vitamin B12  -     Folate  Obesity, morbid (CMS/HCC)  Comments:  Lifestyle modifications  She is increasing exrecise      Orders:  -     CBC and differential - WAM and non-WAM; Future  -     Comprehensive metabolic panel; Future  -     TSH with reflex; Future  -     Albumin, Urine, Random (Microalbumin Random, Including Creatinine); Future  -     Vitamin B12; Future  -     Folate; Future  -     CBC and differential - WAM and non-WAM  -     Comprehensive metabolic panel  -     TSH with reflex  -     Vitamin B12  -     Folate  Type 2 diabetes mellitus with diabetic nephropathy, without long-term current use of insulin (CMS/HCC)  Comments:  2000 calorie diabetic diet  DM eye exam yearly  A1C today  Continue Metformin 500 mg twice daily for now, will decide after A1C if changes needed  Orders:  -     Cancel: POCT glycosylated hemoglobin (Hb A1C)  -     CBC and differential - WAM and non-WAM; Future  -     Comprehensive metabolic panel; Future  -     TSH with reflex; Future  -     Albumin, Urine, Random (Microalbumin Random, Including Creatinine); Future  -     Vitamin B12; Future  -     Folate; Future  -     CBC and differential - WAM and non-WAM  -     Comprehensive metabolic panel  -     TSH with reflex  -     Vitamin B12  -     Folate  -     Hemoglobin A1c; Future  -     Hemoglobin A1c  Dyslipidemia  Comments:  Low cholesterol diet  Lipids improving  Continue Atorvastatin 40 mg at bedtime  Monitor LFTs  Orders:  -     CBC and differential - WAM and non-WAM; Future  -     Comprehensive  metabolic panel; Future  -     TSH  with reflex; Future  -     Albumin, Urine, Random (Microalbumin Random, Including Creatinine); Future  -     Vitamin B12; Future  -     Folate; Future  -     CBC and differential - WAM and non-WAM  -     Comprehensive metabolic panel  -     TSH with reflex  -     Vitamin B12  -     Folate

## 2022-06-02 LAB — EXTERNAL COLONOSCOPY

## 2022-06-28 NOTE — Progress Notes (Signed)
External colonoscopy order created to attached and close care gap.

## 2022-09-29 ENCOUNTER — Ambulatory Visit: Admit: 2022-09-29 | Discharge: 2022-09-29 | Payer: MEDICARE | Attending: Internal Medicine | Primary: Internal Medicine

## 2022-09-29 DIAGNOSIS — I10 Essential (primary) hypertension: Secondary | ICD-10-CM

## 2022-09-29 DIAGNOSIS — Z0000 Encounter for general adult medical examination without abnormal findings: Secondary | ICD-10-CM

## 2022-09-29 NOTE — Progress Notes (Signed)
[facility] PRIMARY CARE [facility] [place]  [address]  [address]  [place] [address]  Dept: [phone]  Dept Fax: [phone]     Patient ID: Alyssa Orr is a 67 y.o. female who presents for Subsequent Medicare Annual Wellness Visit.    Subjective   HPI      Patient presents for medicare annual exam.  Patient states that she feels well  She denies any chest pain or palpitations  She denies dizziness or lightheadedness  She denies sob or cough  No dizziness or lighteahdedness  No nausea or vomiting  No fever or chills  Patient states that she eats a healthy diet. She is trying to follow lifestyle modifications to improve her weight  She states that she likes to walk for exercise  Patient denies dysuria  She denies urinary incontinence  She denies any abdominal pain  No falls this year          Patient Active Problem List   Diagnosis   . Breast density   . Type 2 diabetes mellitus with diabetic nephropathy, 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) (HHS-HCC)   . Primary hypertension   . Dyslipidemia   . Obesity, morbid (CMS-HCC) (HHS-HCC)     Current Outpatient Medications   Medication Instructions   . atorvastatin (LIPITOR) 40 mg, oral, Daily   . losartan (Cozaar) 50 mg tablet TAKE 1 TABLET BY MOUTH EVERYDAY AT BEDTIME   . metFORMIN (GLUCOPHAGE) 500 mg, oral, 2 times daily with meals     No Known Allergies  Past Medical History:   Diagnosis Date   . Allergic    . Chronic kidney disease    . Diabetes mellitus (CMS-HCC) (HHS-HCC)    . Headache    . High cholesterol    . Hypertension    . Obesity    . Pneumonia    . Varicella      History reviewed. No pertinent surgical history.    Health Risk Assessment    Psychosocial Risk  Patient Health Questionnaire-9 Score: (P) 0  How difficult have these problems made it for you to do your work, take care of things at home, or get along with other people?:  (P) Not difficult at all  Behavioral Risk  Exercise (moderate to strenuous) - How many days per week?: (!) (P) 2 days  Exercise (moderate to strenuous) - How many minutes at this level?: (!) (P) 30 min   Alcohol - How often do you drink?: Never  Alcohol - How many drinks per day?: Patient does not drink      Objective   Visit Vitals  BP 120/80   Pulse 74   Temp 36.7 C (98 F) (Temporal)   Ht 1.613 m   Wt 97.5 kg   SpO2 99%   BMI 37.48 kg/m   BSA 2.09 m       Physical Exam  Constitutional:       Appearance: Normal appearance.      Comments: pleasant   HENT:      Head: Normocephalic and atraumatic.   Eyes:      Conjunctiva/sclera: Conjunctivae normal.   Cardiovascular:      Rate and Rhythm: Normal rate and regular rhythm.      Heart sounds: Normal heart sounds.   Pulmonary:      Effort: Pulmonary effort is normal.      Breath sounds: Normal breath sounds.  Abdominal:      General: Abdomen is flat. Bowel sounds are normal.      Palpations: Abdomen is soft.   Musculoskeletal:         General: No swelling.      Cervical back: Neck supple.   Lymphadenopathy:      Cervical: No cervical adenopathy.   Skin:     General: Skin is warm.   Neurological:      General: No focal deficit present.      Mental Status: She is alert.   Psychiatric:         Mood and Affect: Mood normal.                  Assessment/Plan   Alyssa Orr was seen today for subsequent medicare annual wellness visit.  Medicare annual wellness visit, subsequent  Comments:  Heart healthy diet  Exercise as tolerated 4X weely for 30 minutes each  Eye exam yearly  Skin exam yearly  mammogram uptodate  Colonoscopy uptodate  Orders:  -     CBC and differential - WAM and non-WAM; Future  -     Comprehensive metabolic panel; Future  -     Lipid panel; Future  -     Hemoglobin A1c; Future  -     TSH with reflex; Future  -     Albumin, Urine, Random (Microalbumin Random, Including Creatinine); Future  -     Urinalysis with Reflex Microscopic; Future  Obesity, morbid  (CMS-HCC) (HHS-HCC)  Comments:  Continue lifestyle modifications  Reassess at each visit  Orders:  -     CBC and differential - WAM and non-WAM; Future  -     Comprehensive metabolic panel; Future  -     Lipid panel; Future  -     Hemoglobin A1c; Future  -     TSH with reflex; Future  -     Albumin, Urine, Random (Microalbumin Random, Including Creatinine); Future  -     Urinalysis with Reflex Microscopic; Future  Type 2 diabetes mellitus with diabetic nephropathy, 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) (HHS-HCC)  Comments:  2000 calorie diabetic diet  DM eye exam yearly  A1C today  Contineu Metformin 500 mg tiwce daily  Orders:  -     CBC and differential - WAM and non-WAM; Future  -     Comprehensive metabolic panel; Future  -     Lipid panel; Future  -     Hemoglobin A1c; Future  -     TSH with reflex; Future  -     Albumin, Urine, Random (Microalbumin Random, Including Creatinine); Future  -     Urinalysis with Reflex Microscopic; Future  Dyslipidemia  Comments:  Low cholesterol diet  Lipid today  Continue Atorvastatin 40 mg daily  Monior LFts  Orders:  -     CBC and differential - WAM and non-WAM; Future  -     Comprehensive metabolic panel; Future  -     Lipid panel; Future  -     Hemoglobin A1c; Future  -     TSH with reflex; Future  -     Albumin, Urine, Random (Microalbumin Random, Including Creatinine); Future  -     Urinalysis with Reflex Microscopic; Future  Primary hypertension  Comments:  Low sodium diet  BP controlled  Continue Losartan 50 mg daily  Orders:  -     CBC and differential - WAM and non-WAM; Future  -  Comprehensive metabolic panel; Future  -     Lipid panel; Future  -     Hemoglobin A1c; Future  -     TSH with reflex; Future  -     Albumin, Urine, Random (Microalbumin Random, Including Creatinine); Future  -     Urinalysis with Reflex Microscopic; Future  Screening for depression  Other orders  -     Urine Microscopic      Advance Care Planning  Advance Directive/Living  Will: Yes   Health Care Power of Attorney: No  Time in minutes Spent Discussing Advanced Directives: 1-15 minutes    Medicare Screenings Reviewed  PHQ-2  Domestic violence  Patient Health Questionnaire-9 Score: 0   Interpretation: Negative screening.     Follow-up & Interventions: Maintain annual screening - No additional Follow-up required     Patient Care Team:  Carron Curie, MD as PCP - General (Internal Medicine)  Tyrone Apple, RN as Care Manager (Nursing)    Major risk factors and recommendations:         >30 minutes spent with patient including 8 minute depression screen, patient education and counseling, chart review and documentation and coordination of care. All questions answered, patient advised to call with any concerns

## 2022-09-30 LAB — ALBUMIN, URINE, RANDOM (INCLUDING CREATININE)
Alb/Creat Ratio: 776 mg/g{creat} — ABNORMAL HIGH (ref 0–29)
Albumin Ur: 722.9 ug/mL
Creat Ur: 93.1 mg/dL

## 2022-09-30 LAB — CBC W/DIFF
Baso Abs: 0 10*3/uL (ref 0.0–0.2)
Basos: 0 %
Eos Abs: 0.1 10*3/uL (ref 0.0–0.4)
Eos: 1 %
Hct: 39.1 % (ref 34.0–46.6)
Hgb: 12.4 g/dL (ref 11.1–15.9)
Immature Grans Abs: 0 10*3/uL (ref 0.0–0.1)
Immature Granulocytes: 0 %
Lymphs Abs: 3.5 10*3/uL — ABNORMAL HIGH (ref 0.7–3.1)
Lymphs: 38 %
MCH: 25.3 pg — ABNORMAL LOW (ref 26.6–33.0)
MCHC: 31.7 g/dL (ref 31.5–35.7)
MCV: 80 fL (ref 79–97)
Monocytes: 5 %
MonocytesAbs: 0.5 10*3/uL (ref 0.1–0.9)
Neutrophils Abs: 5.2 10*3/uL (ref 1.4–7.0)
Neutrophils: 56 %
Platelets: 340 10*3/uL (ref 150–450)
RBC: 4.91 x10E6/uL (ref 3.77–5.28)
RDW: 14.6 % (ref 11.7–15.4)
WBC: 9.4 10*3/uL (ref 3.4–10.8)

## 2022-09-30 LAB — COMPREHENSIVE METABOLIC PANEL
A/G Ratio: 1.8 (ref 1.2–2.2)
ALT: 17 IU/L (ref 0–32)
AST: 18 IU/L (ref 0–40)
Albumin: 4.6 g/dL (ref 3.9–4.9)
Alk Phosphatase: 95 IU/L (ref 44–121)
Anion Gap: 15 mmol/L (ref 10.0–18.0)
BUN/Creat Ratio: 20 (ref 12–28)
BUN: 15 mg/dL (ref 8–27)
Bili Total: 0.6 mg/dL (ref 0.0–1.2)
Calcium: 9.9 mg/dL (ref 8.7–10.3)
Carbon Dioxide: 26 mmol/L (ref 20–29)
Chloride: 98 mmol/L (ref 96–106)
Creat: 0.76 mg/dL (ref 0.57–1.00)
Globulin Total: 2.5 g/dL (ref 1.5–4.5)
Glucose: 186 mg/dL — ABNORMAL HIGH (ref 70–99)
Potassium: 4.7 mmol/L (ref 3.5–5.2)
Protein Total: 7.1 g/dL (ref 6.0–8.5)
Sodium: 139 mmol/L (ref 134–144)
eGFR: 85 mL/min/{1.73_m2} (ref >59)

## 2022-09-30 LAB — LIPID PANEL
Cholesterol: 205 mg/dL — ABNORMAL HIGH (ref 100–199)
HDL Cholesterol: 71 mg/dL (ref >39)
LDLc Calc (NIH): 118 mg/dL — ABNORMAL HIGH (ref 0–99)
Non-HDL Chol: 134 mg/dL — ABNORMAL HIGH (ref 0–129)
Triglycerides: 91 mg/dL (ref 0–149)
VLDLc Calc: 16 mg/dL (ref 5–40)

## 2022-09-30 LAB — TSH WITH REFLEX: TSH: 2.18 u[IU]/mL (ref 0.450–4.500)

## 2022-09-30 LAB — HEMOGLOBIN A1C: HgbA1C: 9.6 % — ABNORMAL HIGH (ref 4.8–5.6)

## 2022-10-01 LAB — URINE MICROSCOPIC (REFLEX ONLY)
Bacteria Ur: NONE SEEN
Casts Ur: NONE SEEN /LPF
Epithelial Cells (non renal) Ur: NONE SEEN /HPF (ref 0–10)

## 2022-10-01 LAB — URINALYSIS
Bilirubin Ur: NEGATIVE
Glucose Ur: NEGATIVE
Ketones Ur: NEGATIVE
Nitrite Ur: NEGATIVE
Occult Blood Ur: NEGATIVE
Specific Gravity Ur: 1.02 (ref 1.005–1.030)
Urobilinogen,Semi-Qn Ur: 1 mg/dL (ref 0.2–1.0)
WBC Esterase Ur: NEGATIVE
pH Ur: 6 (ref 5.0–7.5)

## 2022-10-06 NOTE — Telephone Encounter (Signed)
Defer to pcp.  Looks like you just saw her.

## 2022-11-28 NOTE — Telephone Encounter (Signed)
Last refill sent: 09/16/21    Last visit: 09/19/22    Next visit: 03/31/23

## 2023-03-31 ENCOUNTER — Encounter: Payer: MEDICARE | Attending: Internal Medicine | Primary: Internal Medicine

## 2023-04-11 ENCOUNTER — Encounter: Payer: MEDICARE | Attending: Internal Medicine

## 2023-04-12 ENCOUNTER — Ambulatory Visit: Admit: 2023-04-12 | Discharge: 2023-04-12 | Payer: MEDICARE | Attending: Internal Medicine

## 2023-04-12 DIAGNOSIS — E1121 Type 2 diabetes mellitus with diabetic nephropathy: Secondary | ICD-10-CM

## 2023-04-12 MED ORDER — empagliflozin (Jardiance) 25 mg
25 | ORAL_TABLET | Freq: Every day | ORAL | 11 refills | Status: DC
Start: 2023-04-12 — End: 2023-04-12

## 2023-04-12 MED ORDER — atorvastatin (Lipitor) 40 mg tablet
40 | ORAL_TABLET | Freq: Every day | ORAL | 3 refills | 90.00000 days | Status: AC
Start: 2023-04-12 — End: ?

## 2023-04-12 MED ORDER — losartan (Cozaar) 50 mg tablet
50 | ORAL_TABLET | Freq: Every evening | ORAL | 3 refills | 90.00000 days | Status: AC
Start: 2023-04-12 — End: ?

## 2023-04-12 NOTE — Progress Notes (Signed)
[address], [address]  [place], [place] [address]  Today's date: 04/12/23  Name: Alyssa Orr  DOB: 05-26-1956  Age: 68 y.o.  PCP: Alyssa Curie, MD    Chief complaint:   Chief Complaint   Patient presents with    Diabetes     Patient presents for follo wup of HTN and DM.  Patient states that her husband passed away 2 weeks ago.  They were married or 42 years  They retired together and had been adjusting to retirement life .  Patient states that she is trying to cope  She states that she has a lot of family support  Patient states that she had some chest tightness  She states that she has been anxious since her husband passed away.  She denies any sob  No dizziness or lightheadedeness  No nausea or vomiting      History of Present Illness:       Patient presents for   Review of Systems: Review of Systems   Constitutional:  Negative for diaphoresis.   HENT:  Negative for congestion.    Respiratory:  Negative for wheezing.    Genitourinary:  Negative for dysuria and hematuria.   Neurological:  Negative for weakness.        Past Medical History:   Past Medical History:   Diagnosis Date    Allergic     Chronic kidney disease     Diabetes mellitus (Multi-HCC)     Headache     High cholesterol (CMS-HCC)     Hypertension (CMS-HCC)     Obesity     Pneumonia     Varicella        Past Surgical History:   History reviewed. No pertinent surgical history.    Social History:   Social History     Socioeconomic History    Marital status: Married     Spouse name: Alyssa Orr    Number of children: 0    Years of education: None    Highest education level: None   Occupational History    None   Tobacco Use    Smoking status: Former     Packs/day: 0.50     Years: 10.00     Additional pack years: 0.00     Total pack years: 5.00     Types: Cigarettes     Quit date: 01/11/1983     Years since quitting: 40.2    Smokeless tobacco: Never    Tobacco comments:     Quit over  40 years ago   Vaping Use    Vaping Use: Never used   Substance  and Sexual Activity    Alcohol use: Not Currently    Drug use: Never    Sexual activity: Not Currently     Partners: Male     Birth control/protection: None     Comment: Husband   Other Topics Concern    None   Social History Narrative    None     Social Determinants of Health     Financial Resource Strain: Low Risk  (04/11/2023)    Overall Financial Resource Strain (CARDIA)     Difficulty of Paying Living Expenses: Not hard at all   Food Insecurity: No Food Insecurity (04/11/2023)    Hunger Vital Sign     Worried About Running Out of Food in the Last Year: Never true     Ran Out of Food in the Last Year: Never true   Transportation  Needs: No Transportation Needs (04/11/2023)    PRAPARE - Therapist, art (Medical): No     Lack of Transportation (Non-Medical): No   Physical Activity: Insufficiently Active (09/08/2021)    Exercise Vital Sign     Days of Exercise per Week: 2 days     Minutes of Exercise per Session: 20 min   Stress: No Stress Concern Present (09/08/2021)    [employer] - Occupational Stress Questionnaire     Feeling of Stress : Not at all   Social Connections: Socially Integrated (09/08/2021)    Social Connection and Isolation Panel [NHANES]     Frequency of Communication with Friends and Family: More than three times a week     Frequency of Social Gatherings with Friends and Family: Three times a week     Attends Religious Services: More than 4 times per year     Active Member of Clubs or Organizations: Yes     Attends Banker Meetings: More than 4 times per year     Marital Status: Married   Catering manager Violence: Not on file   Housing Stability: Low Risk  (04/11/2023)    Housing Stability Vital Sign     Unable to Pay for Housing in the Last Year: No     Number of Places Lived in the Last Year: 1     Unstable Housing in the Last Year: No        Family History:   Family History   Problem Relation Name Age of Onset    Heart attack  Mother Alyssa Orr     Heart disease Mother Alyssa Orr     Colon cancer Father Alyssa Burlington. Turner, Sr.     Diabetes Sister Alyssa Orr     Heart attack Brother      No Known Problems Brother      Heart attack Maternal Grandmother Alyssa Orr     Diabetes Maternal Grandmother Alyssa Orr     Heart attack Maternal Grandfather      Hypertension Paternal Grandmother Alyssa Orr     Cancer Paternal Grandfather Alyssa Penta, Sr.        Allergies: No Known Allergies    Current Medications:   Current Outpatient Medications   Medication Sig Dispense Refill    ASPIRIN LOW-STRENGTH ORAL Take 1 tablet by mouth once daily.      metFORMIN (Glucophage) 500 mg tablet TAKE 1 TABLET (500 MG) BY MOUTH WITH BREAKFAST AND WITH EVENING MEAL 180 tablet 3    atorvastatin (Lipitor) 40 mg tablet Take 1 tablet (40 mg) by mouth once daily. 90 tablet 3    losartan (Cozaar) 50 mg tablet Take 1 tablet (50 mg) by mouth at bedtime. 90 tablet 3     No current facility-administered medications for this visit.       Vital Signs: Visit Vitals  Ht 1.613 m   Wt 94.3 kg   BMI 36.26 kg/m   BSA 2.06 m        Physical Exam: Physical Exam  Constitutional:       Appearance: Normal appearance.      Comments: pleasant   HENT:      Head: Normocephalic and atraumatic.   Eyes:      Conjunctiva/sclera: Conjunctivae normal.   Cardiovascular:      Rate and Rhythm: Normal rate and regular rhythm.      Heart sounds: Normal  heart sounds.   Pulmonary:      Effort: Pulmonary effort is normal.      Breath sounds: Normal breath sounds.   Abdominal:      General: Abdomen is flat. Bowel sounds are normal.      Palpations: Abdomen is soft.   Musculoskeletal:      Cervical back: Neck supple.   Skin:     General: Skin is warm.   Neurological:      General: No focal deficit present.      Mental Status: She is alert.   Psychiatric:         Mood and Affect: Mood normal.          Assessment and Plan:       [name] was seen  today for diabetes.    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  -     Hemoglobin A1c; Future  -     CBC and differential - WAM and non-WAM; Future  -     Comprehensive metabolic panel; Future  -     Hemoglobin A1c  -     CBC and differential - WAM and non-WAM  -     Comprehensive metabolic panel  -     Albumin, Urine, Random (Microalbumin Random, Including Creatinine); Future  -     Albumin, Urine, Random (Microalbumin Random, Including Creatinine)    Dyslipidemia (CMS-HCC)  Comments:  Low cholesterol diet  Lipids controlled  Contineu current management  Monitor lfts  Orders:  -     atorvastatin (Lipitor) 40 mg tablet; Take 1 tablet (40 mg) by mouth once daily.    Primary hypertension (CMS-HCC)  Comments:  Low sodium diet  BP improving  Continue Losaratn 50 mg daily  Orders:  -     losartan (Cozaar) 50 mg tablet; Take 1 tablet (50 mg) by mouth at bedtime.  -     Hemoglobin A1c; Future  -     CBC and differential - WAM and non-WAM; Future  -     Comprehensive metabolic panel; Future  -     Hemoglobin A1c  -     CBC and differential - WAM and non-WAM  -     Comprehensive metabolic panel    Grieving (CMS-HCC)  Comments:  Pt lost her husband   Emotional support provided    Chest tightness  -     ECG 12 lead

## 2023-04-13 LAB — CBC W/DIFF
Baso Abs: 0 10*3/uL (ref 0.0–0.2)
Basos: 0 %
Eos Abs: 0.1 10*3/uL (ref 0.0–0.4)
Eos: 1 %
Hct: 37.7 % (ref 34.0–46.6)
Hgb: 12.5 g/dL (ref 11.1–15.9)
Immature Grans Abs: 0 10*3/uL (ref 0.0–0.1)
Immature Granulocytes: 0 %
Lymphs Abs: 3.1 10*3/uL (ref 0.7–3.1)
Lymphs: 32 %
MCH: 26.1 pg — ABNORMAL LOW (ref 26.6–33.0)
MCHC: 33.2 g/dL (ref 31.5–35.7)
MCV: 79 fL (ref 79–97)
Monocytes: 6 %
MonocytesAbs: 0.6 10*3/uL (ref 0.1–0.9)
Neutrophils Abs: 5.7 10*3/uL (ref 1.4–7.0)
Neutrophils: 61 %
Platelets: 367 10*3/uL (ref 150–450)
RBC: 4.79 x10E6/uL (ref 3.77–5.28)
RDW: 14.3 % (ref 11.7–15.4)
WBC: 9.5 10*3/uL (ref 3.4–10.8)

## 2023-04-13 LAB — COMPREHENSIVE METABOLIC PANEL
ALT: 15 IU/L (ref 0–32)
AST: 16 IU/L (ref 0–40)
Albumin: 4.3 g/dL (ref 3.9–4.9)
Alk Phosphatase: 96 IU/L (ref 44–121)
Anion Gap: 16 mmol/L (ref 10.0–18.0)
BUN/Creat Ratio: 10 — ABNORMAL LOW (ref 12–28)
BUN: 10 mg/dL (ref 8–27)
Bili Total: 0.6 mg/dL (ref 0.0–1.2)
Calcium: 9.6 mg/dL (ref 8.7–10.3)
Carbon Dioxide: 25 mmol/L (ref 20–29)
Chloride: 100 mmol/L (ref 96–106)
Creat: 0.96 mg/dL (ref 0.57–1.00)
Globulin Total: 2.5 g/dL (ref 1.5–4.5)
Glucose: 187 mg/dL — ABNORMAL HIGH (ref 70–99)
Potassium: 4.6 mmol/L (ref 3.5–5.2)
Protein Total: 6.8 g/dL (ref 6.0–8.5)
Sodium: 141 mmol/L (ref 134–144)
eGFR: 64 mL/min/{1.73_m2} (ref >59)

## 2023-04-13 LAB — HEMOGLOBIN A1C: HgbA1C: 11.5 % — ABNORMAL HIGH (ref 4.8–5.6)

## 2023-04-13 LAB — ALBUMIN, URINE, RANDOM (INCLUDING CREATININE)
Alb/Creat Ratio: 429 mg/g{creat} — ABNORMAL HIGH (ref 0–29)
Albumin Ur: 323.5 ug/mL
Creat Ur: 75.4 mg/dL

## 2023-04-14 MED ORDER — SITagliptin phosphate (Januvia) 50 mg tablet
50 | ORAL_TABLET | Freq: Every day | ORAL | 11 refills | Status: DC
Start: 2023-04-14 — End: 2023-06-28

## 2023-04-14 NOTE — Telephone Encounter (Signed)
Pt is requesting a call from md to go over lab results and medication changes

## 2023-04-14 NOTE — Telephone Encounter (Signed)
Left message

## 2023-04-16 MED ORDER — FreeStyle glucose monitoring kit
0 refills | 30.00000 days | Status: DC
Start: 2023-04-16 — End: 2023-06-28

## 2023-04-16 MED ORDER — FreeStyle Test strips
ORAL_STRIP | 11 refills | Status: DC
Start: 2023-04-16 — End: 2023-06-28

## 2023-04-16 NOTE — Telephone Encounter (Signed)
order

## 2023-04-17 NOTE — Telephone Encounter (Signed)
Pt called back requesting a call back.

## 2023-04-17 NOTE — Telephone Encounter (Signed)
Pt left aws message returning mds call

## 2023-04-17 NOTE — Telephone Encounter (Signed)
Left message for patient

## 2023-04-18 NOTE — Telephone Encounter (Signed)
AWS message:   Pt called back again asking for a call back

## 2023-04-18 NOTE — Addendum Note (Signed)
Addended by: Carron Curie on: 04/18/2023 04:30 PM     Modules accepted: Orders

## 2023-04-18 NOTE — Telephone Encounter (Signed)
See other message

## 2023-04-18 NOTE — Telephone Encounter (Addendum)
Spoke to patient and discussed lab results  Starting Januvia  Repeat lab in 1 month

## 2023-06-28 ENCOUNTER — Ambulatory Visit: Admit: 2023-06-28 | Discharge: 2023-06-28 | Payer: MEDICARE

## 2023-06-28 DIAGNOSIS — Z7689 Persons encountering health services in other specified circumstances: Secondary | ICD-10-CM

## 2023-06-28 LAB — POCT HGB A1C: POCT Hemoglobin A1C: 8.4 % — AB (ref ?–5.7)

## 2023-06-28 MED ORDER — SITagliptin phosphate (Januvia) 100 mg tablet
100 | ORAL_TABLET | Freq: Every day | ORAL | 3 refills | Status: DC
Start: 2023-06-28 — End: 2023-07-05

## 2023-06-28 NOTE — Assessment & Plan Note (Addendum)
DM diet and exercise encouraged  No changes to metformin, con't with 500mg  BID  Increase Januvia from 50mg  to 100mg   RTC in 3-4 months for repeat A1c  Patient refused PCV20 and flu

## 2023-06-28 NOTE — Progress Notes (Signed)
[address] [place] [address]  Dept: [phone]  Dept Fax: [phone]           History of Present Illness    Chief Complaint:   Chief Complaint   Patient presents with    Establish Care       HPI: Alyssa Orr is a 68 y.o. female who presents to clinic for:     Previous patient of Dr. Adolm Joseph, here to establish care  AWV due Oct 24, 2023    Husband passed away 3 months ago. Still grieving    Vax due: PCV20, flu, Tdap (Declines all vaccines today)    DM2  - A1c in 03/2023 was 11.2, reduced to 8.4  - currently taking: metformin 500mg  BID and Januvia 50mg  daily  - currently no set exercise regimen but plans to walk 3x per week  - meals are home cooked, making balanced diet .  - patient reports last DM eye exam was 2 weeks ago and was normal ([facility] located in [place])    HTN  - taking losartan 50mg  daily    HLD  - taking Lipitor 40g daily  - LDL goal <70 due to DM2    ROS  A complete 11 system ROS was performed (constitutional, HEENT, cardiovascular, respiratory, gastrointestinal, genitourinary, musculoskeletal, skin, neurological, psychiatric) and was negative aside from the pertinent positives and negatives noted in the HPI.    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 No pertinent surgical history.  Social History     Tobacco Use    Smoking status: Former     Current packs/day: 0.00     Average packs/day: 0.5 packs/day for 10.0 years (5.0 ttl pk-yrs)     Types: Cigarettes     Start date:  01/10/1973     Quit date: 01/11/1983     Years since quitting: 40.4    Smokeless tobacco: Never    Tobacco comments:     Quit over  40 years ago   Vaping Use    Vaping status: Never Used   Substance Use Topics    Alcohol use: Not Currently    Drug use: Never     Family History   Problem Relation Name Age of Onset    Heart attack Mother Harley Alto     Heart disease Mother Harley Alto     Colon cancer Father Conrad Burlington. Turner, Sr.     Diabetes Sister Johna Sheriff     Heart attack Brother      No Known Problems Brother      Heart attack Maternal Grandmother Arelia Annie Sable     Diabetes Maternal Grandmother Romeo Rabon     Heart attack Maternal Grandfather      Hypertension Paternal Grandmother Orlin Hilding     Cancer Paternal Grandfather  Konrad Penta, Sr.      No Known Allergies    Current Outpatient Medications   Medication Instructions    ASPIRIN LOW-STRENGTH ORAL 1 tablet, oral, Daily    atorvastatin (LIPITOR) 40 mg, oral, Daily    losartan (COZAAR) 50 mg, oral, Nightly    metFORMIN (GLUCOPHAGE) 500 mg, oral, 2 times daily with meals    SITagliptin phosphate (JANUVIA) 100 mg, oral, Daily       Physical Exam  Visit Vitals  BP 124/86 (BP Location: Right arm, Patient Position: Sitting, BP Cuff Size: Large adult)   Pulse 89   Ht 1.613 m   Wt 93.4 kg   SpO2 98%   BMI 35.88 kg/m   BSA 2.05 m       Physical Exam  Vitals and nursing note reviewed.   Constitutional:       Appearance: Normal appearance.   HENT:      Head: Normocephalic and atraumatic.      Right Ear: External ear normal.      Left Ear: External ear normal.   Eyes:      Extraocular Movements: Extraocular movements intact.      Conjunctiva/sclera: Conjunctivae normal.   Cardiovascular:      Rate and Rhythm: Normal rate and regular rhythm.      Pulses: Normal pulses.      Heart sounds: Normal heart sounds.   Pulmonary:      Effort: Pulmonary effort is normal.      Breath sounds: Normal breath sounds.    Musculoskeletal:      Cervical back: Normal range of motion and neck supple.   Skin:     General: Skin is warm.      Capillary Refill: Capillary refill takes less than 2 seconds.   Neurological:      General: No focal deficit present.      Mental Status: She is alert and oriented to person, place, and time.   Psychiatric:         Mood and Affect: Mood normal.         Behavior: Behavior normal.         Thought Content: Thought content normal.         Judgment: Judgment normal.           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) E11.21    Overview     - A1c in 03/2023 was 11.2  - currently taking: metformin 500mg  BID and Januvia 50mg  daily  - Repeat A1c 8.4 in 06/2023, plan to increase Januvia to 100mg  daily         Current Assessment & Plan     DM diet and exercise encouraged  No changes to metformin, con't with 500mg  BID  Increase Januvia from 50mg  to 100mg   RTC in 3-4 months for repeat A1c  Patient refused PCV20 and flu         Relevant Medications    SITagliptin phosphate (Januvia) 100 mg tablet    Primary hypertension I10    Overview     - taking losartan 50mg  daily         Current Assessment & Plan     BP well controlled, no changes  DASH diet and exercise recommended  Dyslipidemia associated with type 2 diabetes mellitus (Multi-HCC) E11.69, E78.5    Overview     - taking Lipitor 40g daily  - LDL goal <70 due to DM2         Relevant Medications    SITagliptin phosphate (Januvia) 100 mg tablet    Vaccine refused by parent Z28.82    Overview     Refused Tdap, PCV20 and Flu in 06/2023          Other Visit Diagnoses         Codes    Encounter to establish care with new doctor    -  Primary Z76.89    Grief reaction     F43.21    husband passed away approx 3 months ago. Has strong support through church and close friends. Pt declines therapy           I spent  more than 35 minutes on the same day with established patient providing the following care:   --Preparing to see the patient  --Documenting clinical information in the electronic or other health record  --Performing a medically appropriate exam and/or evaluation  --Ordering medications, tests or procedures  --Reviewing tests, outside office notes  --Counseling and educating the patient, family and/or caregiver      All questions and concerns were addressed. Pt reports understanding and agrees to the discussed plan.   The pt is encouraged to call with concerns/questions.   Medication side effects, indications, contraindications, and correct use were discussed with the patient, all questions answered to patient's satisfaction.    Follow up in about 3 months (around 10/03/2023) for Medicare Annual Wellness Visit.    Future Appointments   Date Time Provider Department Center   10/18/2023 11:20 AM Norina Buzzard, MD [facility]

## 2023-06-28 NOTE — Assessment & Plan Note (Signed)
BP well controlled, no changes  DASH diet and exercise recommended

## 2023-06-29 NOTE — Telephone Encounter (Signed)
The patient called and states the pharmacy quoted januvia for $216 for 90 days supply. The patient would like to know if pcp can prescribe an alternative.

## 2023-07-04 NOTE — Telephone Encounter (Signed)
Spoke to payor and the medication is a tier 3 with no tier exception possible. The advice the patient to contact member services to see if they could help or ask pharmacy if they have manufacturer coupons

## 2023-07-05 MED ORDER — SITagliptin phosphate (Januvia) 100 mg tablet
100 | ORAL_TABLET | Freq: Every day | ORAL | 2 refills | Status: AC
Start: 2023-07-05 — End: 2023-10-03

## 2023-07-05 NOTE — Telephone Encounter (Signed)
New Rx signed for 30 days.

## 2023-07-05 NOTE — Addendum Note (Signed)
Addended by: Norina Buzzard on: 07/05/2023 10:36 AM     Modules accepted: Orders

## 2023-10-18 ENCOUNTER — Ambulatory Visit: Admit: 2023-10-18 | Discharge: 2023-10-18 | Payer: MEDICARE

## 2023-10-18 DIAGNOSIS — Z0000 Encounter for general adult medical examination without abnormal findings: Secondary | ICD-10-CM

## 2023-10-18 MED ORDER — empagliflozin (Jardiance) 10 mg
10 | ORAL_TABLET | Freq: Every day | ORAL | 0 refills | 30.00000 days | Status: AC
Start: 2023-10-18 — End: 2023-11-17

## 2023-10-18 MED ORDER — empagliflozin (Jardiance) 25 mg
25 | ORAL_TABLET | Freq: Every day | ORAL | 1 refills | 30.00000 days | Status: AC
Start: 2023-10-18 — End: 2024-05-16

## 2023-10-18 NOTE — Assessment & Plan Note (Addendum)
 Con't with metformin  500mg  BID   Discontinue Januvia   Switch to Jardiance  10mg  daily for 30 days, then increase to 25mg  daily  If patient has issue with insurance coverage, we will return to Januvia  50mg  daily and add in Glipizide.

## 2023-10-18 NOTE — Assessment & Plan Note (Signed)
Well-controlled, no changes needed 

## 2023-10-18 NOTE — Progress Notes (Signed)
 [facility]  [address]  [address]  [place] [address]  Dept: [phone]  Dept Fax: [phone]     Patient ID: Alyssa Orr is a 68 y.o. female who presents for Subsequent Medicare Annual Wellness Visit.    Subjective   Vax due: PCV20, flu, Tdap (Declines all vaccines today)    - last mammogram 06/2023: BR-1 Negative  - last colonoscopy 05/2022 w/ Dr. Derenda Flax, internal hemorrhoid other wise normal. Repeat in 5 years  - last bone density: unknown, patient reports 3 years ago     DM2  - A1c in 03/2023 was 11.2, reduced to 8.4  - currently taking: metformin  500mg  BID and Januvia  50mg  daily  - currently no set exercise regimen but plans to walk 3x per week  - meals are home cooked, making balanced diet .  - patient reports last DM eye exam at [facility] located in [place]  - in 06/2023, Januvia  was increased to 100mg  daily but this caused joint pain so she reduced her dose back down to 50mg  daily   - could not tolerate increased metformin , caused hair loss     HTN  - taking losartan  50mg  daily     HLD  - taking Lipitor 40g daily  - LDL goal <70 due to DM2    Husband passed away approx 6 months ago. She recently returned from vacation. 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 Only with extended periods of driving. She will wear compression socks            Problem List[1]  Current Outpatient Medications   Medication Instructions    ASPIRIN LOW-STRENGTH ORAL 1 tablet, Daily    atorvastatin  (LIPITOR) 40 mg, oral, Daily    empagliflozin  (JARDIANCE ) 10 mg, oral, Daily    [START ON 11/18/2023] empagliflozin  (JARDIANCE ) 25 mg, oral, Daily    losartan  (COZAAR ) 50 mg, oral, Nightly    metFORMIN  (GLUCOPHAGE ) 500 mg, oral, 2 times daily with meals     Allergies[2]  Medical History[3]  Surgical History[4]  Family  History[5]  Social History[6]    Health Risk Assessment    Psychosocial Risk  Patient Health Questionnaire-2 Score: 0Behavioral Risk  Exercise (moderate to strenuous) - How many days per week?: (!) 2 days  Exercise (moderate to strenuous) - How many minutes at this level?: (!) 30 min Alcohol - How often do you drink?: Never  Alcohol - How many drinks per day?: Patient does not drink    Objective   Visit Vitals  BP 124/86 (BP Location: Left arm, Patient Position: Sitting, BP Cuff Size: Adult)   Pulse 84   Ht 1.613 m   Wt 94.8 kg   SpO2 96%   BMI 36.44 kg/m   BSA 2.06 m       Physical Exam  Vitals and nursing note reviewed.   Constitutional:       Appearance: Normal appearance.   HENT:      Head: Normocephalic and atraumatic.      Right Ear: Tympanic membrane and external ear normal.      Left Ear: Tympanic membrane, ear canal and external ear normal.      Nose: Nose normal.      Mouth/Throat:      Mouth: Mucous membranes are moist.   Eyes:  Extraocular Movements: Extraocular movements intact.      Conjunctiva/sclera: Conjunctivae normal.   Neck:      Thyroid: No thyroid mass, thyromegaly or thyroid tenderness.   Cardiovascular:      Rate and Rhythm: Normal rate and regular rhythm.      Pulses: Normal pulses.      Heart sounds: Normal heart sounds.   Pulmonary:      Effort: Pulmonary effort is normal.      Breath sounds: Normal breath sounds.   Abdominal:      General: Abdomen is flat. Bowel sounds are normal.      Palpations: Abdomen is soft.   Musculoskeletal:      Cervical back: Normal range of motion and neck supple.      Right lower leg: No edema.      Left lower leg: No edema.   Lymphadenopathy:      Cervical: No cervical adenopathy.   Skin:     General: Skin is warm.      Capillary Refill: Capillary refill takes less than 2 seconds.   Neurological:      General: No focal deficit present.      Mental Status: She is alert and oriented to person, place, and time.   Psychiatric:         Mood and Affect: Mood  normal.         Behavior: Behavior normal.         Thought Content: Thought content normal.         Judgment: Judgment normal.                  Assessment/Plan   [name] was seen today for subsequent medicare annual wellness visit.  Medicare annual wellness visit, subsequent  Comments:  patient refused all vaccines  Primary hypertension   Assessment & Plan:  Well controlled, no changes needed  Orders:  -     Comprehensive metabolic panel; Future  Dyslipidemia associated with type 2 diabetes mellitus (Multi-HCC)  -     Lipid panel; Future  -     TSH with reflex; Future  Type 2 diabetes mellitus with diabetic nephropathy, without long-term current use of insulin (Multi-HCC)  Assessment & Plan:  Con't with metformin  500mg  BID   Discontinue Januvia   Switch to Jardiance  10mg  daily for 30 days, then increase to 25mg  daily  If patient has issue with insurance coverage, we will return to Januvia  50mg  daily and add in Glipizide.  Orders:  -     empagliflozin  (Jardiance ) 10 mg; Take 1 tablet (10 mg) by mouth once daily.  -     empagliflozin  (Jardiance ) 25 mg; Take 1 tablet (25 mg) by mouth once daily.  -     Hemoglobin A1c; Future  -     Comprehensive metabolic panel; Future  BMI 36.0-36.9,adult  -     Vitamin D  25 hydroxy; Future  Encounter for osteoporosis screening in asymptomatic postmenopausal patient  -     BD DEXA AXIAL; Future      Advance Care Planning  Advance Directive/Living Will: Yes   Health Care Power of Attorney: Yes  Time in minutes Spent Discussing Advanced Directives: 1-15 minutes    Medicare Screenings Reviewed  PHQ-2  ADL/iADL  Domestic violence  Physical activity  Patient Health Questionnaire-2 Score: 0   Interpretation: Negative screening.     Follow-up & Interventions: Maintain annual screening - No additional Follow-up required     Patient Care Team:  Zennie Ayars  Johanna Mutter, MD as PCP - General (Family Medicine)    Major risk factors and recommendations:              [1]   Patient Active Problem List  Diagnosis     Breast density    Type 2 diabetes mellitus with diabetic nephropathy, without long-term current use of insulin (Multi-HCC)    Primary hypertension     Dyslipidemia associated with type 2 diabetes mellitus (Multi-HCC)    Vaccine refused by parent   [2] No Known Allergies  [3]   Past Medical History:  Diagnosis Date    Allergic     Chronic kidney disease     Diabetes mellitus (Multi-HCC)     Headache     High cholesterol      Hypertension      Obesity     Pneumonia     Varicella    [4] History reviewed. No pertinent surgical history.  [5]   Family History  Problem Relation Name Age of Onset    Heart attack Mother Donavan Fuchs     Heart disease Mother Donavan Fuchs     Colon cancer Father Chalice Colt. Turner, Sr.     Diabetes Sister Waldemar Guillaume     Heart attack Brother      No Known Problems Brother      Heart attack Maternal Grandmother Arelia Josephina Nicks     Diabetes Maternal Grandmother Wendel Hals     Heart attack Maternal Grandfather      Hypertension Paternal Grandmother Rollo Clonts     Cancer Paternal Grandfather Fredrik Jensen, Sr.    [6]   Social History  Socioeconomic History    Marital status: Married     Spouse name: Marcel Senter    Number of children: 0    Years of education: None    Highest education level: None   Occupational History    None   Tobacco Use    Smoking status: Former     Current packs/day: 0.00     Average packs/day: 0.5 packs/day for 10.0 years (5.0 ttl pk-yrs)     Types: Cigarettes     Start date: 01/10/1973     Quit date: 01/11/1983     Years since quitting: 40.7    Smokeless tobacco: Never    Tobacco comments:     Quit over  40 years ago   Vaping Use    Vaping status: Never Used   Substance and Sexual Activity    Alcohol use: Not Currently    Drug use: Never    Sexual activity: Not Currently     Partners: Male     Birth control/protection: None     Comment: Husband   Other Topics Concern    None   Social History Narrative    None     Social  Determinants of Health     Financial Resource Strain: Low Risk  (10/17/2023)    Overall Financial Resource Strain (CARDIA)     Difficulty of Paying Living Expenses: Not hard at all   Food Insecurity: Unknown (10/17/2023)    Hunger Vital Sign     Worried About Running Out of Food in the Last Year: Never true     Ran Out of Food in the Last Year: Not on file   Transportation Needs: No Transportation Needs (10/17/2023)    PRAPARE - Transportation     Lack of Transportation (Medical): No     Lack of  Transportation (Non-Medical): No   Physical Activity: Insufficiently Active (10/18/2023)    Exercise Vital Sign     Days of Exercise per Week: 2 days     Minutes of Exercise per Session: 30 min   Stress: No Stress Concern Present (10/18/2023)    [employer] of Occupational Health - Occupational Stress Questionnaire     Feeling of Stress : Not at all   Social Connections: Unknown (10/18/2023)    Social Connection and Isolation Panel [NHANES]     Frequency of Communication with Friends and Family: More than three times a week     Frequency of Social Gatherings with Friends and Family: Not on file     Attends Religious Services: Not on file     Active Member of Clubs or Organizations: Yes     Attends Banker Meetings: Not on file     Marital Status: Not on file   Intimate Partner Violence: Not on file   Housing Stability: Unknown (10/17/2023)    Housing Stability Vital Sign     Unable to Pay for Housing in the Last Year: Not on file     Number of Times Moved in the Last Year: Not on file     Homeless in the Last Year: No

## 2023-10-18 NOTE — Patient Instructions (Addendum)
 How to lead a healthy lifestyle:  - Regular exercise with goal of 150 min/week of some form of activity that they enjoy.    - Eat fruits and veggies with goal of 5 servings per day, emphasis on green leafy veggies.    - Limit red meat, fried food, and greasy food to one time per week or less.    - Consume no less than 64 ounces of water daily  - Routine dental cleanings every 6 months.    - Routine eye care  - Use daily sunscreen to prevent skin cancer and sun damage    Please call [facility] to schedule your bone density:   Phone Number: [phone]  Address: [address], [place], [place] [address]

## 2023-10-19 LAB — COMPREHENSIVE METABOLIC PANEL
ALT: 10 IU/L (ref 0–32)
AST: 14 IU/L (ref 0–40)
Albumin: 4.3 g/dL (ref 3.9–4.9)
Alk Phosphatase: 79 IU/L (ref 44–121)
Anion Gap: 16 mmol/L (ref 10.0–18.0)
BUN/Creat Ratio: 16 (ref 12–28)
BUN: 15 mg/dL (ref 8–27)
Bili Total: 0.4 mg/dL (ref 0.0–1.2)
Calcium: 9.5 mg/dL (ref 8.7–10.3)
Carbon Dioxide: 23 mmol/L (ref 20–29)
Chloride: 100 mmol/L (ref 96–106)
Creat: 0.96 mg/dL (ref 0.57–1.00)
Globulin Total: 2.5 g/dL (ref 1.5–4.5)
Glucose: 137 mg/dL — ABNORMAL HIGH (ref 70–99)
Potassium: 4.5 mmol/L (ref 3.5–5.2)
Protein Total: 6.8 g/dL (ref 6.0–8.5)
Sodium: 139 mmol/L (ref 134–144)
eGFR: 64 mL/min/{1.73_m2} (ref >59)

## 2023-10-19 LAB — LIPID PANEL
Cholesterol: 172 mg/dL (ref 100–199)
HDL Cholesterol: 72 mg/dL (ref >39)
LDLc Calc (NIH): 90 mg/dL (ref 0–99)
Non-HDL Chol: 100 mg/dL (ref 0–129)
Triglycerides: 51 mg/dL (ref 0–149)
VLDLc Calc: 10 mg/dL (ref 5–40)

## 2023-10-19 LAB — VITAMIN D 25 HYDROXY: Vitamin D, 25-Hydroxy: 34.9 ng/mL (ref 30.0–100.0)

## 2023-10-19 LAB — TSH WITH REFLEX: TSH: 1.71 u[IU]/mL (ref 0.450–4.500)

## 2023-10-19 LAB — HEMOGLOBIN A1C: HgbA1C: 7.2 % — ABNORMAL HIGH (ref 4.8–5.6)

## 2023-10-24 MED ORDER — atorvastatin (Lipitor) 80 mg tablet
80 | ORAL_TABLET | Freq: Every day | ORAL | 3 refills | 90.00000 days | Status: AC
Start: 2023-10-24 — End: 2024-10-23

## 2023-10-25 LAB — EXTERNAL BONE DENSITY

## 2023-11-10 NOTE — Progress Notes (Signed)
 External bone density order created to attached results and close care gap.

## 2023-11-14 NOTE — Result Quicknote (Signed)
 Letter generated and sent to patient via portal message.  Osteopenia with lifestyle recommendations

## 2024-02-23 MED ORDER — metFORMIN (Glucophage) 500 mg tablet
500 | ORAL_TABLET | Freq: Two times a day (BID) | ORAL | 0 refills | 90.00000 days | Status: DC
Start: 2024-02-23 — End: 2024-03-04

## 2024-02-23 NOTE — Telephone Encounter (Signed)
 Refill.

## 2024-03-04 ENCOUNTER — Ambulatory Visit: Admit: 2024-03-04 | Discharge: 2024-03-04 | Payer: MEDICARE

## 2024-03-04 LAB — POCT GLYCOSYLATED HEMOGLOBIN (HBA1C) - HOME KIT (CLIA WAIVED/FDA APPROVED): POCT Hemoglobin A1C: 9.3 % — AB (ref ?–5.7)

## 2024-03-04 MED ORDER — losartan (Cozaar) 50 mg tablet
50 | ORAL_TABLET | Freq: Every evening | ORAL | 3 refills | 90.00000 days | Status: AC
Start: 2024-03-04 — End: ?

## 2024-03-04 MED ORDER — atorvastatin (Lipitor) 80 mg tablet
80 | ORAL_TABLET | Freq: Every day | ORAL | 3 refills | 90.00000 days | Status: AC
Start: 2024-03-04 — End: 2025-03-04

## 2024-03-04 MED ORDER — metFORMIN (Glucophage) 500 mg tablet
500 | ORAL_TABLET | Freq: Two times a day (BID) | ORAL | 3 refills | 90.00000 days | Status: AC
Start: 2024-03-04 — End: ?

## 2024-03-04 NOTE — Progress Notes (Signed)
 [facility]  [address]  [address]  [place] [address]  Dept: [phone]  Dept Fax: [phone]     Alyssa Orr is a 68 y.o. who presents for Establish Care (Patient would like to get tested for her Glucose ).     Assessment/Plan:      Chronic condition management  -HTN, HLD, statin use  -DM2 w/nephropathy-oral meds, statin, ARB. Did not tolerate higher doses of metformin .  -obese    Screening:  -AWV 10/18/23  -DXA 10/25/23 osteopenia-takes otc vit D  -MMG 06/2023: BR-1  -colonoscopy 05/2022 w/ Dr. Patrisha, internal hemorrhoid other wise normal. Repeat in 5 years  -vaccinations: declines all    Care team:  -Routine eye exams and dental    Referral:   03/04/2024 endocrinology [facility] Alyssa Orr was seen today for establish care.  Type 2 diabetes mellitus with diabetic nephropathy, without long-term current use of insulin (Multi-HCC)  Assessment & Plan:  In late April, she discontinued januvia  due to expense of the med. She's continued on metformin  and made positive lifestyle changes and lost weight. Repeat A1C today in office is above goal. Encouraged her to continue with the positive changes she's made and referred to endocrinology.   Orders:  -     POCT glycosylated hemoglobin (HbA1C) - Home Kit (CLIA waived/FDA approved)  -     metFORMIN  (Glucophage ) 500 mg tablet; Take 1 tablet (500 mg) by mouth with breakfast and with evening meal.  -     losartan  (Cozaar ) 50 mg tablet; Take 1 tablet (50 mg) by mouth at bedtime.  -     atorvastatin  (Lipitor) 80 mg tablet; Take 1 tablet (80 mg) by mouth once daily.  -     INT  [facility]; Future  Mixed hyperlipidemia   -     atorvastatin  (Lipitor) 80 mg tablet; Take 1 tablet (80 mg) by mouth once daily.  Obesity (BMI 30-39.9)  -     metFORMIN  (Glucophage ) 500 mg tablet; Take 1 tablet (500 mg) by mouth with breakfast and with evening meal.  -     INT  [facility];  Future  Primary hypertension   -     losartan  (Cozaar ) 50 mg tablet; Take 1 tablet (50 mg) by mouth at bedtime.      -Comprehensive review of the chart and patient history was performed today and discussed with the patient. Chart was updated, gaps in care and anticipatory guidance were discussed and updated as appropriate.   -Chronic conditions and associated treatment plan evaluated. Conditions are stable and current treatment plan to be continued except as otherwise stated above.   -Medication management: medications reviewed, updated and/or refilled today.     -I plan to maintain a long-term, longitudinal relationship with this patient as their PCP, overseeing care of chronic conditions. This care relationship has influenced my decision-making and treatment plans during today's encounter.  -Next scheduled primary care appointment: Follow up in about 7 months (around 10/17/2024) for AWV. Visit date not found    Subjective:     HPI: see A/P (problem oriented charting)       Current Medications[1]     History:      Medical:  has a past medical history of Allergic, Chronic kidney disease, Diabetes mellitus (Multi-HCC), Headache, High cholesterol, Hypertension, Obesity, Pneumonia, and Varicella.  Surgical:  has no past surgical history on file.  Social: Married  [place], living in [place]  -substance use:  reports that she quit smoking about 41 years ago. Her smoking use included cigarettes. She started smoking about 51 years ago. She has a 5 pack-year smoking history. She has never used smokeless tobacco. She reports that she does not currently use alcohol. She reports that she does not use drugs.  -No obstetric history on file.  reports that she is not currently sexually active and has had partner(s) who are female. She reports using the following method of birth control/protection: None.  Family: Cancer-related family history includes Cancer in her father and paternal grandfather; Colon cancer in her  father.    Objective:      BP 124/80   Pulse 91   Temp 36.1 C (97 F) (Temporal)   Ht 1.626 m   Wt 93.8 kg   BP Readings from Last 3 Encounters:   03/04/24 124/80   10/18/23 124/86   06/28/23 124/86     Wt Readings from Last 3 Encounters:   03/04/24 93.8 kg   10/18/23 94.8 kg   06/28/23 93.4 kg     BMI Readings from Last 3 Encounters:   03/04/24 35.50 kg/m   10/18/23 36.44 kg/m   06/28/23 35.88 kg/m        Physical exam:  General: no acute distress  Lungs: normal pulmonary effort, normal breath sounds  Heart: regular rate, regular rhythm, no murmurs  Neurological: alert    Data Reviewed:      Lab Results   Component Value Date    HGBA1C 9.3 (A) 03/04/2024    HGBA1C 7.2 (H) 10/18/2023    TSH 1.710 10/18/2023    TSH 2.180 09/29/2022     No results found for: RUBIGG, RABIESAB, HIV1X2, RPR, HPV, PSAFREE, PSA, TESTOSTERONE, TESTOST, LH, FSH, PROLACTIN, CA125, ANA, CCPIGGIGA, RF, SEDRATE, CRP  Lab Results   Component Value Date    NA 139 10/18/2023    K 4.5 10/18/2023    BUN 15 10/18/2023    CREATININE 0.96 10/18/2023    EGFR 64 10/18/2023    EGFR 64 04/12/2023    MICROALBUR 323.5 04/12/2023    Lab Results   Component Value Date    WBC 9.5 04/12/2023    HGB 12.5 04/12/2023    MCV 79 04/12/2023    RBC 4.79 04/12/2023    PLT 367 04/12/2023    VITAMINB12 740 09/10/2021    VITAMINB12 444 10/30/2020    FOLATE 19.5 10/30/2020    VITD25 34.9 10/18/2023     Lab Results   Component Value Date    HDL 72 10/18/2023    LDLCALC 90 10/18/2023    TRIG 51 10/18/2023    AST 14 10/18/2023    ALT 10 10/18/2023            Prepared/Electronically Signed by: Delon Mail, DO,03/04/2024 4:30 PM    Note to patient: your medical notes are available for review after our visit. These notes are primarily for communication between healthcare professionals and may use medical terminology or abbreviations that could be unfamiliar. The content reflects my clinical opinion at the time of your visit. Please  note that this document was generated using voice recognition software; some typographical or word errors (i.e. he/she) may be present despite my proofreading. It was a pleasure to see you for your visit today.                          Generalized Anxiety  Disorder Screening - GAD-7 Score: 0  Interpretation: Negative screening.  Follow up & Intervention: Maintain annual screening - No additional Follow-up required          [1]   Current Outpatient Medications:     ASPIRIN LOW-STRENGTH ORAL, Take 1 tablet by mouth once daily., Disp: , Rfl:     atorvastatin  (Lipitor) 80 mg tablet, Take 1 tablet (80 mg) by mouth once daily., Disp: 90 tablet, Rfl: 3    losartan  (Cozaar ) 50 mg tablet, Take 1 tablet (50 mg) by mouth at bedtime., Disp: 90 tablet, Rfl: 3    metFORMIN  (Glucophage ) 500 mg tablet, Take 1 tablet (500 mg) by mouth with breakfast and with evening meal., Disp: 180 tablet, Rfl: 3

## 2024-03-04 NOTE — Assessment & Plan Note (Addendum)
 In late April, she discontinued januvia  due to expense of the med. She's continued on metformin  and made positive lifestyle changes and lost weight. Repeat A1C today in office is above goal. Encouraged her to continue with the positive changes she's made and referred to endocrinology.

## 2024-05-02 NOTE — Progress Notes (Signed)
 Sent patient [facility] portal message regarding the 20/20 [facility] opportunity being held at [address] [place], [place], [address] (next to [facility] and [employer]) on Tuesday, November 25. Self sign up link was included.     Please note: while this may be labeled a visit, this encounter may be a telephone call to the patient or administrative coordination to assist the patient with addressing a required test, screening, or appointment.  This will not generate a bill, nor will there be any charge for this encounter.  Thank you.

## 2024-07-10 LAB — EXTERNAL MAMMOGRAPHY

## 2024-07-11 NOTE — Progress Notes (Signed)
 External mammo order create to atttach results and close care gap.
